# Patient Record
Sex: Female | Born: 1935 | Race: White | Hispanic: No | Smoking: Never smoker
Health system: Southern US, Community
[De-identification: ages and names within clinical notes are randomized; demographics above are authoritative.]

## PROBLEM LIST (undated history)

## (undated) DIAGNOSIS — I998 Other disorder of circulatory system: Secondary | ICD-10-CM

## (undated) DIAGNOSIS — R0600 Dyspnea, unspecified: Secondary | ICD-10-CM

## (undated) DIAGNOSIS — R609 Edema, unspecified: Secondary | ICD-10-CM

## (undated) DIAGNOSIS — E785 Hyperlipidemia, unspecified: Secondary | ICD-10-CM

## (undated) DIAGNOSIS — I2510 Atherosclerotic heart disease of native coronary artery without angina pectoris: Secondary | ICD-10-CM

## (undated) DIAGNOSIS — I10 Essential (primary) hypertension: Secondary | ICD-10-CM

## (undated) DIAGNOSIS — R0602 Shortness of breath: Secondary | ICD-10-CM

## (undated) DIAGNOSIS — M549 Dorsalgia, unspecified: Secondary | ICD-10-CM

## (undated) DIAGNOSIS — E039 Hypothyroidism, unspecified: Secondary | ICD-10-CM

## (undated) HISTORY — DX: Other disorder of circulatory system: I99.8

## (undated) HISTORY — DX: Dyspnea, unspecified: R06.00

## (undated) HISTORY — DX: Atherosclerotic heart disease of native coronary artery without angina pectoris: I25.10

## (undated) HISTORY — DX: Shortness of breath: R06.02

## (undated) HISTORY — DX: Edema, unspecified: R60.9

## (undated) HISTORY — PX: ABDOMINAL HYSTERECTOMY: SHX81

## (undated) HISTORY — DX: Essential (primary) hypertension: I10

## (undated) HISTORY — DX: Hyperlipidemia, unspecified: E78.5

## (undated) HISTORY — DX: Hypothyroidism, unspecified: E03.9

## (undated) HISTORY — DX: Dorsalgia, unspecified: M54.9

---

## 1994-01-03 HISTORY — PX: CORONARY ARTERY BYPASS GRAFT: SHX141

## 2001-04-04 HISTORY — PX: CARDIAC CATHETERIZATION: SHX172

## 2006-11-16 HISTORY — PX: CARDIAC CATHETERIZATION: SHX172

## 2009-02-08 ENCOUNTER — Inpatient Hospital Stay (HOSPITAL_COMMUNITY): Admission: EM | Admit: 2009-02-08 | Discharge: 2009-02-11 | Payer: Self-pay | Admitting: Neurosurgery

## 2009-02-19 ENCOUNTER — Encounter: Admission: RE | Admit: 2009-02-19 | Discharge: 2009-02-19 | Payer: Self-pay | Admitting: Neurosurgery

## 2009-03-05 ENCOUNTER — Encounter: Admission: RE | Admit: 2009-03-05 | Discharge: 2009-03-05 | Payer: Self-pay | Admitting: Neurosurgery

## 2010-03-25 LAB — BASIC METABOLIC PANEL
BUN: 24 mg/dL — ABNORMAL HIGH (ref 6–23)
Chloride: 104 mEq/L (ref 96–112)
Creatinine, Ser: 1.02 mg/dL (ref 0.4–1.2)
Potassium: 4.1 mEq/L (ref 3.5–5.1)

## 2010-03-25 LAB — GLUCOSE, CAPILLARY

## 2010-12-24 ENCOUNTER — Encounter (INDEPENDENT_AMBULATORY_CARE_PROVIDER_SITE_OTHER): Payer: Medicare Other | Admitting: Ophthalmology

## 2010-12-24 ENCOUNTER — Encounter (INDEPENDENT_AMBULATORY_CARE_PROVIDER_SITE_OTHER): Payer: Self-pay | Admitting: Ophthalmology

## 2010-12-24 DIAGNOSIS — H35729 Serous detachment of retinal pigment epithelium, unspecified eye: Secondary | ICD-10-CM

## 2010-12-24 DIAGNOSIS — H3530 Unspecified macular degeneration: Secondary | ICD-10-CM

## 2010-12-24 DIAGNOSIS — H43819 Vitreous degeneration, unspecified eye: Secondary | ICD-10-CM

## 2011-01-21 ENCOUNTER — Ambulatory Visit (INDEPENDENT_AMBULATORY_CARE_PROVIDER_SITE_OTHER): Payer: Medicare Other | Admitting: Ophthalmology

## 2011-02-11 ENCOUNTER — Encounter (INDEPENDENT_AMBULATORY_CARE_PROVIDER_SITE_OTHER): Payer: Medicare Other | Admitting: Ophthalmology

## 2011-02-11 DIAGNOSIS — H353290 Exudative age-related macular degeneration, unspecified eye, stage unspecified: Secondary | ICD-10-CM

## 2011-02-11 DIAGNOSIS — H43819 Vitreous degeneration, unspecified eye: Secondary | ICD-10-CM

## 2011-02-11 DIAGNOSIS — H3530 Unspecified macular degeneration: Secondary | ICD-10-CM

## 2011-02-14 ENCOUNTER — Encounter (INDEPENDENT_AMBULATORY_CARE_PROVIDER_SITE_OTHER): Payer: Medicare Other | Admitting: Ophthalmology

## 2011-02-14 DIAGNOSIS — H353290 Exudative age-related macular degeneration, unspecified eye, stage unspecified: Secondary | ICD-10-CM

## 2011-02-14 DIAGNOSIS — H3530 Unspecified macular degeneration: Secondary | ICD-10-CM

## 2011-02-14 DIAGNOSIS — H43819 Vitreous degeneration, unspecified eye: Secondary | ICD-10-CM

## 2011-03-11 ENCOUNTER — Encounter (INDEPENDENT_AMBULATORY_CARE_PROVIDER_SITE_OTHER): Payer: Medicare Other | Admitting: Ophthalmology

## 2011-03-11 DIAGNOSIS — H353290 Exudative age-related macular degeneration, unspecified eye, stage unspecified: Secondary | ICD-10-CM

## 2011-03-11 DIAGNOSIS — H3530 Unspecified macular degeneration: Secondary | ICD-10-CM

## 2011-03-11 DIAGNOSIS — H43819 Vitreous degeneration, unspecified eye: Secondary | ICD-10-CM

## 2011-04-01 ENCOUNTER — Encounter (INDEPENDENT_AMBULATORY_CARE_PROVIDER_SITE_OTHER): Payer: Medicare Other | Admitting: Ophthalmology

## 2011-04-01 DIAGNOSIS — H3530 Unspecified macular degeneration: Secondary | ICD-10-CM

## 2011-04-01 DIAGNOSIS — H43819 Vitreous degeneration, unspecified eye: Secondary | ICD-10-CM

## 2011-04-01 DIAGNOSIS — H353290 Exudative age-related macular degeneration, unspecified eye, stage unspecified: Secondary | ICD-10-CM

## 2011-04-29 ENCOUNTER — Encounter (INDEPENDENT_AMBULATORY_CARE_PROVIDER_SITE_OTHER): Payer: Medicare Other | Admitting: Ophthalmology

## 2011-04-29 DIAGNOSIS — H3530 Unspecified macular degeneration: Secondary | ICD-10-CM

## 2011-04-29 DIAGNOSIS — H353290 Exudative age-related macular degeneration, unspecified eye, stage unspecified: Secondary | ICD-10-CM

## 2011-04-29 DIAGNOSIS — H43819 Vitreous degeneration, unspecified eye: Secondary | ICD-10-CM

## 2011-05-26 ENCOUNTER — Encounter (INDEPENDENT_AMBULATORY_CARE_PROVIDER_SITE_OTHER): Payer: Medicare Other | Admitting: Ophthalmology

## 2011-05-26 DIAGNOSIS — H3530 Unspecified macular degeneration: Secondary | ICD-10-CM

## 2011-05-26 DIAGNOSIS — H43819 Vitreous degeneration, unspecified eye: Secondary | ICD-10-CM

## 2011-05-26 DIAGNOSIS — H353290 Exudative age-related macular degeneration, unspecified eye, stage unspecified: Secondary | ICD-10-CM

## 2011-06-04 DIAGNOSIS — I10 Essential (primary) hypertension: Secondary | ICD-10-CM

## 2011-06-04 HISTORY — DX: Essential (primary) hypertension: I10

## 2011-06-22 DIAGNOSIS — R609 Edema, unspecified: Secondary | ICD-10-CM

## 2011-06-22 HISTORY — DX: Edema, unspecified: R60.9

## 2011-06-23 ENCOUNTER — Encounter (INDEPENDENT_AMBULATORY_CARE_PROVIDER_SITE_OTHER): Payer: Medicare Other | Admitting: Ophthalmology

## 2011-06-23 DIAGNOSIS — H3530 Unspecified macular degeneration: Secondary | ICD-10-CM

## 2011-06-23 DIAGNOSIS — H43819 Vitreous degeneration, unspecified eye: Secondary | ICD-10-CM

## 2011-06-23 DIAGNOSIS — H353290 Exudative age-related macular degeneration, unspecified eye, stage unspecified: Secondary | ICD-10-CM

## 2011-07-21 ENCOUNTER — Encounter (INDEPENDENT_AMBULATORY_CARE_PROVIDER_SITE_OTHER): Payer: Medicare Other | Admitting: Ophthalmology

## 2011-07-21 DIAGNOSIS — H35039 Hypertensive retinopathy, unspecified eye: Secondary | ICD-10-CM

## 2011-07-21 DIAGNOSIS — I10 Essential (primary) hypertension: Secondary | ICD-10-CM

## 2011-07-21 DIAGNOSIS — H43819 Vitreous degeneration, unspecified eye: Secondary | ICD-10-CM

## 2011-07-21 DIAGNOSIS — H353290 Exudative age-related macular degeneration, unspecified eye, stage unspecified: Secondary | ICD-10-CM

## 2011-07-21 DIAGNOSIS — H3530 Unspecified macular degeneration: Secondary | ICD-10-CM

## 2011-08-25 ENCOUNTER — Encounter (INDEPENDENT_AMBULATORY_CARE_PROVIDER_SITE_OTHER): Payer: Medicare Other | Admitting: Ophthalmology

## 2011-08-25 DIAGNOSIS — I10 Essential (primary) hypertension: Secondary | ICD-10-CM

## 2011-08-25 DIAGNOSIS — H353290 Exudative age-related macular degeneration, unspecified eye, stage unspecified: Secondary | ICD-10-CM

## 2011-08-25 DIAGNOSIS — H43819 Vitreous degeneration, unspecified eye: Secondary | ICD-10-CM

## 2011-08-25 DIAGNOSIS — H3530 Unspecified macular degeneration: Secondary | ICD-10-CM

## 2011-09-28 ENCOUNTER — Encounter (INDEPENDENT_AMBULATORY_CARE_PROVIDER_SITE_OTHER): Payer: Medicare Other | Admitting: Ophthalmology

## 2011-09-28 DIAGNOSIS — H43819 Vitreous degeneration, unspecified eye: Secondary | ICD-10-CM

## 2011-09-28 DIAGNOSIS — H3530 Unspecified macular degeneration: Secondary | ICD-10-CM

## 2011-09-28 DIAGNOSIS — I10 Essential (primary) hypertension: Secondary | ICD-10-CM

## 2011-09-28 DIAGNOSIS — H353290 Exudative age-related macular degeneration, unspecified eye, stage unspecified: Secondary | ICD-10-CM

## 2011-09-28 DIAGNOSIS — H35039 Hypertensive retinopathy, unspecified eye: Secondary | ICD-10-CM

## 2011-10-04 DIAGNOSIS — I998 Other disorder of circulatory system: Secondary | ICD-10-CM

## 2011-10-04 HISTORY — DX: Other disorder of circulatory system: I99.8

## 2011-10-11 DIAGNOSIS — R0600 Dyspnea, unspecified: Secondary | ICD-10-CM

## 2011-10-11 HISTORY — DX: Dyspnea, unspecified: R06.00

## 2011-11-02 ENCOUNTER — Encounter (INDEPENDENT_AMBULATORY_CARE_PROVIDER_SITE_OTHER): Payer: Medicare Other | Admitting: Ophthalmology

## 2011-11-02 DIAGNOSIS — H353290 Exudative age-related macular degeneration, unspecified eye, stage unspecified: Secondary | ICD-10-CM

## 2011-11-02 DIAGNOSIS — H43819 Vitreous degeneration, unspecified eye: Secondary | ICD-10-CM

## 2011-11-02 DIAGNOSIS — I10 Essential (primary) hypertension: Secondary | ICD-10-CM

## 2011-11-02 DIAGNOSIS — H35039 Hypertensive retinopathy, unspecified eye: Secondary | ICD-10-CM

## 2011-11-02 DIAGNOSIS — H3530 Unspecified macular degeneration: Secondary | ICD-10-CM

## 2011-12-07 ENCOUNTER — Encounter (INDEPENDENT_AMBULATORY_CARE_PROVIDER_SITE_OTHER): Payer: Medicare Other | Admitting: Ophthalmology

## 2011-12-07 DIAGNOSIS — H43819 Vitreous degeneration, unspecified eye: Secondary | ICD-10-CM

## 2011-12-07 DIAGNOSIS — H353290 Exudative age-related macular degeneration, unspecified eye, stage unspecified: Secondary | ICD-10-CM

## 2011-12-07 DIAGNOSIS — H35039 Hypertensive retinopathy, unspecified eye: Secondary | ICD-10-CM

## 2011-12-07 DIAGNOSIS — H3530 Unspecified macular degeneration: Secondary | ICD-10-CM

## 2011-12-07 DIAGNOSIS — I10 Essential (primary) hypertension: Secondary | ICD-10-CM

## 2012-01-18 ENCOUNTER — Encounter (INDEPENDENT_AMBULATORY_CARE_PROVIDER_SITE_OTHER): Payer: Medicare Other | Admitting: Ophthalmology

## 2012-01-25 ENCOUNTER — Encounter (INDEPENDENT_AMBULATORY_CARE_PROVIDER_SITE_OTHER): Payer: Medicare Other | Admitting: Ophthalmology

## 2012-02-06 ENCOUNTER — Encounter (INDEPENDENT_AMBULATORY_CARE_PROVIDER_SITE_OTHER): Payer: Medicare Other | Admitting: Ophthalmology

## 2012-02-06 DIAGNOSIS — I10 Essential (primary) hypertension: Secondary | ICD-10-CM

## 2012-02-06 DIAGNOSIS — H3530 Unspecified macular degeneration: Secondary | ICD-10-CM

## 2012-02-06 DIAGNOSIS — H35039 Hypertensive retinopathy, unspecified eye: Secondary | ICD-10-CM

## 2012-02-06 DIAGNOSIS — H353290 Exudative age-related macular degeneration, unspecified eye, stage unspecified: Secondary | ICD-10-CM

## 2012-02-06 DIAGNOSIS — H43819 Vitreous degeneration, unspecified eye: Secondary | ICD-10-CM

## 2012-04-02 ENCOUNTER — Encounter (INDEPENDENT_AMBULATORY_CARE_PROVIDER_SITE_OTHER): Payer: PRIVATE HEALTH INSURANCE | Admitting: Ophthalmology

## 2012-04-02 DIAGNOSIS — H3530 Unspecified macular degeneration: Secondary | ICD-10-CM

## 2012-04-02 DIAGNOSIS — I10 Essential (primary) hypertension: Secondary | ICD-10-CM

## 2012-04-02 DIAGNOSIS — H43819 Vitreous degeneration, unspecified eye: Secondary | ICD-10-CM

## 2012-04-02 DIAGNOSIS — H353290 Exudative age-related macular degeneration, unspecified eye, stage unspecified: Secondary | ICD-10-CM

## 2012-04-02 DIAGNOSIS — H35039 Hypertensive retinopathy, unspecified eye: Secondary | ICD-10-CM

## 2012-05-23 ENCOUNTER — Other Ambulatory Visit: Payer: Self-pay | Admitting: *Deleted

## 2012-05-23 MED ORDER — METOPROLOL SUCCINATE ER 100 MG PO TB24: ORAL_TABLET | ORAL | Status: DC

## 2012-05-24 ENCOUNTER — Other Ambulatory Visit: Payer: Self-pay | Admitting: *Deleted

## 2012-05-28 ENCOUNTER — Encounter: Payer: Self-pay | Admitting: *Deleted

## 2012-05-29 ENCOUNTER — Encounter (INDEPENDENT_AMBULATORY_CARE_PROVIDER_SITE_OTHER): Payer: PRIVATE HEALTH INSURANCE | Admitting: Ophthalmology

## 2012-05-29 DIAGNOSIS — H3530 Unspecified macular degeneration: Secondary | ICD-10-CM

## 2012-05-29 DIAGNOSIS — H43819 Vitreous degeneration, unspecified eye: Secondary | ICD-10-CM

## 2012-05-29 DIAGNOSIS — H353290 Exudative age-related macular degeneration, unspecified eye, stage unspecified: Secondary | ICD-10-CM

## 2012-05-29 DIAGNOSIS — I10 Essential (primary) hypertension: Secondary | ICD-10-CM

## 2012-05-29 DIAGNOSIS — H35039 Hypertensive retinopathy, unspecified eye: Secondary | ICD-10-CM

## 2012-06-26 ENCOUNTER — Encounter: Payer: Self-pay | Admitting: Internal Medicine

## 2012-07-23 ENCOUNTER — Encounter (INDEPENDENT_AMBULATORY_CARE_PROVIDER_SITE_OTHER): Payer: PRIVATE HEALTH INSURANCE | Admitting: Ophthalmology

## 2012-07-23 DIAGNOSIS — H43819 Vitreous degeneration, unspecified eye: Secondary | ICD-10-CM

## 2012-07-23 DIAGNOSIS — I10 Essential (primary) hypertension: Secondary | ICD-10-CM

## 2012-07-23 DIAGNOSIS — H353290 Exudative age-related macular degeneration, unspecified eye, stage unspecified: Secondary | ICD-10-CM

## 2012-07-23 DIAGNOSIS — H3530 Unspecified macular degeneration: Secondary | ICD-10-CM

## 2012-07-23 DIAGNOSIS — H35039 Hypertensive retinopathy, unspecified eye: Secondary | ICD-10-CM

## 2012-08-01 ENCOUNTER — Other Ambulatory Visit: Payer: Self-pay | Admitting: *Deleted

## 2012-08-01 MED ORDER — VALSARTAN-HYDROCHLOROTHIAZIDE 320-25 MG PO TABS: 1.0000 | ORAL_TABLET | Freq: Every day | ORAL | Status: DC

## 2012-08-01 NOTE — Telephone Encounter (Signed)
Rx was sent to pharmacy electronically. 

## 2012-09-10 ENCOUNTER — Other Ambulatory Visit: Payer: Self-pay | Admitting: *Deleted

## 2012-09-10 MED ORDER — NITROGLYCERIN 0.4 MG SL SUBL: SUBLINGUAL_TABLET | SUBLINGUAL | Status: AC

## 2012-09-10 MED ORDER — FUROSEMIDE 20 MG PO TABS: 20.0000 mg | ORAL_TABLET | Freq: Every day | ORAL | Status: DC

## 2012-09-10 NOTE — Telephone Encounter (Signed)
Rx was sent to pharmacy electronically. 

## 2012-09-17 ENCOUNTER — Encounter (INDEPENDENT_AMBULATORY_CARE_PROVIDER_SITE_OTHER): Payer: PRIVATE HEALTH INSURANCE | Admitting: Ophthalmology

## 2012-09-17 DIAGNOSIS — I10 Essential (primary) hypertension: Secondary | ICD-10-CM

## 2012-09-17 DIAGNOSIS — H3530 Unspecified macular degeneration: Secondary | ICD-10-CM

## 2012-09-17 DIAGNOSIS — H353290 Exudative age-related macular degeneration, unspecified eye, stage unspecified: Secondary | ICD-10-CM

## 2012-09-17 DIAGNOSIS — H43819 Vitreous degeneration, unspecified eye: Secondary | ICD-10-CM

## 2012-09-17 DIAGNOSIS — H35039 Hypertensive retinopathy, unspecified eye: Secondary | ICD-10-CM

## 2012-10-03 ENCOUNTER — Ambulatory Visit: Payer: Medicare Other | Admitting: Cardiovascular Disease

## 2012-10-05 ENCOUNTER — Encounter: Payer: Self-pay | Admitting: Cardiovascular Disease

## 2012-10-05 ENCOUNTER — Ambulatory Visit (INDEPENDENT_AMBULATORY_CARE_PROVIDER_SITE_OTHER): Payer: Medicare Other | Admitting: Cardiovascular Disease

## 2012-10-05 VITALS — BP 154/71 | HR 57 | Ht 65.0 in | Wt 174.0 lb

## 2012-10-05 DIAGNOSIS — R0609 Other forms of dyspnea: Secondary | ICD-10-CM

## 2012-10-05 DIAGNOSIS — I10 Essential (primary) hypertension: Secondary | ICD-10-CM

## 2012-10-05 DIAGNOSIS — I25810 Atherosclerosis of coronary artery bypass graft(s) without angina pectoris: Secondary | ICD-10-CM

## 2012-10-05 DIAGNOSIS — I4949 Other premature depolarization: Secondary | ICD-10-CM

## 2012-10-05 DIAGNOSIS — E785 Hyperlipidemia, unspecified: Secondary | ICD-10-CM

## 2012-10-05 DIAGNOSIS — I493 Ventricular premature depolarization: Secondary | ICD-10-CM | POA: Insufficient documentation

## 2012-10-05 MED ORDER — RANOLAZINE ER 500 MG PO TB12: 500.0000 mg | ORAL_TABLET | Freq: Two times a day (BID) | ORAL | Status: DC

## 2012-10-05 MED ORDER — AMLODIPINE BESYLATE 5 MG PO TABS: 5.0000 mg | ORAL_TABLET | Freq: Every day | ORAL | Status: DC

## 2012-10-05 NOTE — Progress Notes (Signed)
Patient ID: Alexis Dyer, female   DOB: Sep 06, 1923, 79 y.o.   MRN: [id number]       CARDIOLOGY CONSULT NOTE  Patient ID: Alexis Dyer MRN: [id number] DOB/AGE: 1923/10/13 79 y.o.  Admit date: (Not on file) Primary Physician No primary provider on file.  Reason for Consultation: cabg, htn, hyperlipidemia, PVC's  HPI: Alexis Dyer is an 79 year old woman who was most recently seen in 04/2012 by Dr. Rennis Golden of [facility]. She has a h/o CABG in 1996, HTN, hyperlipidemia, and bigeminal and trigeminal PVC's. She reportedly had a stress test in 10/2011 which revealed normal LV perfusion with an LVEF of 67%, as per previous office notes and not the official report. She also reportedly had an echocardiogram in 06/2011 which revealed an EF greater than 55%, and mild to moderate tricuspid regurgitation and mild to moderate pulmonary HTN.  She says she struggles with severe GERD and sometimes has to sit up in bed to alleviate the burning. Over the summer, she had 2 or 3 episodes of left arm pain and left-sided shoulder blade pain which was alleviated with one SL nitro. She denies palpitations. She wears trouser stockings (not actual support hose) which alleviate leg cramps.  She has occasional lightheadedness but denies syncope. She gets dyspnea with exertion, from walking as far as from the office to the parking lot. Her dyspnea has not progressed in intensity.  She checks her BP at home and the SBP runs from 147-160 mmHg range.  SocHx: her sister is Alexis Dyer. Nonsmoker.    No Known Allergies  Current Outpatient Prescriptions  Medication Sig Dispense Refill  . aspirin 81 MG tablet Take 81 mg by mouth daily.      . CRESTOR 10 MG tablet Take 1 tablet by mouth daily.      . fenofibrate (TRICOR) 145 MG tablet Take 145 mg by mouth daily.      . fish oil-omega-3 fatty acids 1000 MG capsule Take 1 g by mouth daily. Includes magnesium and zinc and calcium      . furosemide (LASIX) 20 MG tablet  Take 1 tablet (20 mg total) by mouth daily.  30 tablet  8  . isosorbide mononitrate (IMDUR) 60 MG 24 hr tablet Take 60 mg by mouth every morning. & 30 mg by mouth in the evening      . levothyroxine (SYNTHROID, LEVOTHROID) 100 MCG tablet Take 100 mcg by mouth daily before breakfast.      . metoprolol succinate (TOPROL-XL) 100 MG 24 hr tablet Take 1 and 1/2 tablets by mouth daily Take with or immediately following a meal.  140 tablet  3  . nitroGLYCERIN (NITROSTAT) 0.4 MG SL tablet Please 1 tablet under the tongue every 5 minutes for up to 3 doses as needed for chest pain.  25 tablet  4  . ranolazine (RANEXA) 500 MG 12 hr tablet Take 500 mg by mouth daily.      . valsartan-hydrochlorothiazide (DIOVAN-HCT) 320-25 MG per tablet Take 1 tablet by mouth daily.  30 tablet  9   No current facility-administered medications for this visit.    Past Medical History  Diagnosis Date  . SOB (shortness of breath)   . Hypertension 06/2011    2D Echo EF>55% mild to moderate tricuspid regurgitation and mild to moderate pulmonary hypertension.  . Ischemia 10/2011    stress test EF 67% with no ischemia  . Dyslipidemia   . Hypothyroidism   . CAD (coronary artery disease)  bypass in 1996 2D Echo preformed EF >55%  . Edema 06/22/2011    lower arterial for claudication  . Dyspnea 10/11/2011    stress test EF 67% normal stress test.  . Back pain     Past Surgical History  Procedure Laterality Date  . Coronary artery bypass graft  1996    LIMA to the obtuse marginal, saphenous vein graft to the LAD, saphenous vein graft to the obtuse marginal-1, saphenous vien graft to the posterior diagonal artery performed by Dr Alean Rinne  . Abdominal hysterectomy    . Cardiac catheterization  11/16/2006  . Cardiac catheterization  04/04/2001    History   Social History  . Marital Status: Widowed    Spouse Name: N/A    Number of Children: N/A  . Years of Education: N/A   Occupational History  . Not on  file.   Social History Main Topics  . Smoking status: Never Smoker   . Smokeless tobacco: Never Used  . Alcohol Use: Not on file  . Drug Use: Not on file  . Sexual Activity: Not on file   Other Topics Concern  . Not on file   Social History Narrative  . No narrative on file     Family History  Problem Relation Age of Onset  . Heart attack Father   . Cancer Sister   . Heart disease Sister   . Diabetes    . Hypertension       Prior to Admission medications   Medication Sig Start Date End Date Taking? Authorizing Provider  aspirin 81 MG tablet Take 81 mg by mouth daily.   Yes Historical Provider, MD  CRESTOR 10 MG tablet Take 1 tablet by mouth daily. 08/23/12  Yes Historical Provider, MD  fenofibrate (TRICOR) 145 MG tablet Take 145 mg by mouth daily.   Yes Historical Provider, MD  fish oil-omega-3 fatty acids 1000 MG capsule Take 1 g by mouth daily. Includes magnesium and zinc and calcium   Yes Historical Provider, MD  furosemide (LASIX) 20 MG tablet Take 1 tablet (20 mg total) by mouth daily. 09/10/12  Yes Chrystie Nose, MD  isosorbide mononitrate (IMDUR) 60 MG 24 hr tablet Take 60 mg by mouth every morning. & 30 mg by mouth in the evening   Yes Historical Provider, MD  levothyroxine (SYNTHROID, LEVOTHROID) 100 MCG tablet Take 100 mcg by mouth daily before breakfast.   Yes Historical Provider, MD  metoprolol succinate (TOPROL-XL) 100 MG 24 hr tablet Take 1 and 1/2 tablets by mouth daily Take with or immediately following a meal. 05/23/12  Yes Chrystie Nose, MD  nitroGLYCERIN (NITROSTAT) 0.4 MG SL tablet Please 1 tablet under the tongue every 5 minutes for up to 3 doses as needed for chest pain. 09/10/12  Yes Chrystie Nose, MD  ranolazine (RANEXA) 500 MG 12 hr tablet Take 500 mg by mouth daily.   Yes Historical Provider, MD  valsartan-hydrochlorothiazide (DIOVAN-HCT) 320-25 MG per tablet Take 1 tablet by mouth daily. 08/01/12  Yes Chrystie Nose, MD     Review of systems  complete and found to be negative unless listed above in HPI     Physical exam Blood pressure 154/71, pulse 57, height 5\' 5"  (1.651 m), weight 174 lb (78.926 kg). General: NAD Neck: No JVD, no thyromegaly or thyroid nodule.  Lungs: Clear to auscultation bilaterally with normal respiratory effort. CV: Nondisplaced PMI.  Heart regular S1/S2, no S3/S4, no murmur.  No peripheral edema.  No carotid bruit.  Normal pedal pulses.  Abdomen: Soft, nontender, no hepatosplenomegaly, no distention.  Skin: Intact without lesions or rashes.  Neurologic: Alert and oriented x 3.  Psych: Normal affect. Extremities: No clubbing or cyanosis.  HEENT: Normal.   Labs:   No results found for this basename: WBC, HGB, HCT, MCV, PLT   No results found for this basename: NA, K, CL, CO2, BUN, CREATININE, CALCIUM, LABALBU, PROT, BILITOT, ALKPHOS, ALT, AST, GLUCOSE,  in the last 168 hours No results found for this basename: CKTOTAL, CKMB, CKMBINDEX, TROPONINI    No results found for this basename: CHOL   No results found for this basename: HDL   No results found for this basename: LDLCALC   No results found for this basename: TRIG   No results found for this basename: CHOLHDL   No results found for this basename: LDLDIRECT         Studies: See HPI  ASSESSMENT AND PLAN: 1. CAD s/p CABG: given her dyspnea and occasional left arm discomfort, I will increase Ranexa to 500 mg bid. No indication for further non-invasive testing at this time. Of note, she had normal LV perfusion in 10/2011. Continue ASA, Crestor, Toprol-XL, and Imdur. 2. HTN: presently uncontrolled. Will start amlodipine 5 mg daily. Continue Diovan-HCT. 3. Dyspnea on exertion: this may be due to a combination of CAD and uncontrolled HTN, with medication adjustments as noted above.   Signed: Prentice Docker, M.D., F.A.C.C.  10/05/2012, 11:36 AM

## 2012-10-05 NOTE — Patient Instructions (Signed)
   Begin Norvasc 5mg  daily  Increase Ranexa to 500mg  twice a day  Continue all other medications.   Your physician wants you to follow up in: 6 months.  You will receive a reminder letter in the mail one-two months in advance.  If you don't receive a letter, please call our office to schedule the follow up appointment

## 2012-10-17 ENCOUNTER — Telehealth: Payer: Self-pay | Admitting: *Deleted

## 2012-10-17 MED ORDER — HYDRALAZINE HCL 25 MG PO TABS: 25.0000 mg | ORAL_TABLET | Freq: Two times a day (BID) | ORAL | Status: DC

## 2012-10-17 NOTE — Telephone Encounter (Signed)
Patient informed and verbalized understanding of plan. 

## 2012-10-17 NOTE — Telephone Encounter (Signed)
Patient's had both stress testing and echocardiography in 2013, echo in 06/2011. No indication to repeat testing as her "heart's pumping function is normal". She had lipids done in April 2014 and these will be repeated in April 2015. Please reassure patient. She can d/c amlodipine due to nausea, and start hydralazine 25 mg BID for BP control.

## 2012-10-17 NOTE — Telephone Encounter (Signed)
Patient concerned that she hasn't had any lab work or echo done recently. Patient thinks its time to have this done. Patient also informed nurse that she started having symptoms of weakness, and nausea after starting the norvasc. Patient said she stopped the norvasc and her symptoms improved. Patient said she re-challenged the norvasc and her symptoms came back. Nurse advised patient that MD would be notified of there request for lab work, and echo as well as the problem she had with her amlodipine. Nurse advised patient to continue holding the norvasc until further notice from our office.

## 2012-10-30 ENCOUNTER — Other Ambulatory Visit: Payer: Self-pay | Admitting: Cardiology

## 2012-10-30 MED ORDER — METOPROLOL SUCCINATE ER 100 MG PO TB24: ORAL_TABLET | ORAL | Status: DC

## 2012-11-13 ENCOUNTER — Encounter: Payer: Self-pay | Admitting: Cardiology

## 2012-11-26 ENCOUNTER — Encounter (INDEPENDENT_AMBULATORY_CARE_PROVIDER_SITE_OTHER): Payer: PRIVATE HEALTH INSURANCE | Admitting: Ophthalmology

## 2012-11-26 DIAGNOSIS — H353290 Exudative age-related macular degeneration, unspecified eye, stage unspecified: Secondary | ICD-10-CM

## 2012-11-26 DIAGNOSIS — H35039 Hypertensive retinopathy, unspecified eye: Secondary | ICD-10-CM

## 2012-11-26 DIAGNOSIS — H43819 Vitreous degeneration, unspecified eye: Secondary | ICD-10-CM

## 2012-11-26 DIAGNOSIS — I10 Essential (primary) hypertension: Secondary | ICD-10-CM

## 2012-11-26 DIAGNOSIS — H3530 Unspecified macular degeneration: Secondary | ICD-10-CM

## 2013-02-04 ENCOUNTER — Encounter (INDEPENDENT_AMBULATORY_CARE_PROVIDER_SITE_OTHER): Payer: PRIVATE HEALTH INSURANCE | Admitting: Ophthalmology

## 2013-02-04 DIAGNOSIS — H43819 Vitreous degeneration, unspecified eye: Secondary | ICD-10-CM

## 2013-02-04 DIAGNOSIS — I10 Essential (primary) hypertension: Secondary | ICD-10-CM

## 2013-02-04 DIAGNOSIS — H35039 Hypertensive retinopathy, unspecified eye: Secondary | ICD-10-CM

## 2013-02-04 DIAGNOSIS — H3530 Unspecified macular degeneration: Secondary | ICD-10-CM

## 2013-02-04 DIAGNOSIS — H353290 Exudative age-related macular degeneration, unspecified eye, stage unspecified: Secondary | ICD-10-CM

## 2013-04-29 ENCOUNTER — Encounter (INDEPENDENT_AMBULATORY_CARE_PROVIDER_SITE_OTHER): Payer: PRIVATE HEALTH INSURANCE | Admitting: Ophthalmology

## 2013-04-29 DIAGNOSIS — H35039 Hypertensive retinopathy, unspecified eye: Secondary | ICD-10-CM

## 2013-04-29 DIAGNOSIS — H3530 Unspecified macular degeneration: Secondary | ICD-10-CM

## 2013-04-29 DIAGNOSIS — I10 Essential (primary) hypertension: Secondary | ICD-10-CM

## 2013-04-29 DIAGNOSIS — H43819 Vitreous degeneration, unspecified eye: Secondary | ICD-10-CM

## 2013-04-29 DIAGNOSIS — H353290 Exudative age-related macular degeneration, unspecified eye, stage unspecified: Secondary | ICD-10-CM

## 2013-05-13 ENCOUNTER — Ambulatory Visit (INDEPENDENT_AMBULATORY_CARE_PROVIDER_SITE_OTHER): Payer: Medicare Other | Admitting: Cardiovascular Disease

## 2013-05-13 ENCOUNTER — Encounter: Payer: Self-pay | Admitting: *Deleted

## 2013-05-13 ENCOUNTER — Encounter: Payer: Self-pay | Admitting: Cardiovascular Disease

## 2013-05-13 VITALS — BP 123/61 | HR 59 | Ht 64.5 in | Wt 177.8 lb

## 2013-05-13 DIAGNOSIS — R5381 Other malaise: Secondary | ICD-10-CM

## 2013-05-13 DIAGNOSIS — I10 Essential (primary) hypertension: Secondary | ICD-10-CM

## 2013-05-13 DIAGNOSIS — Z79899 Other long term (current) drug therapy: Secondary | ICD-10-CM

## 2013-05-13 DIAGNOSIS — I493 Ventricular premature depolarization: Secondary | ICD-10-CM

## 2013-05-13 DIAGNOSIS — R0609 Other forms of dyspnea: Secondary | ICD-10-CM

## 2013-05-13 DIAGNOSIS — R0989 Other specified symptoms and signs involving the circulatory and respiratory systems: Secondary | ICD-10-CM

## 2013-05-13 DIAGNOSIS — E785 Hyperlipidemia, unspecified: Secondary | ICD-10-CM

## 2013-05-13 DIAGNOSIS — I4949 Other premature depolarization: Secondary | ICD-10-CM

## 2013-05-13 DIAGNOSIS — I25810 Atherosclerosis of coronary artery bypass graft(s) without angina pectoris: Secondary | ICD-10-CM

## 2013-05-13 DIAGNOSIS — R5383 Other fatigue: Secondary | ICD-10-CM

## 2013-05-13 MED ORDER — METOPROLOL SUCCINATE ER 50 MG PO TB24: ORAL_TABLET | ORAL | Status: DC

## 2013-05-13 NOTE — Patient Instructions (Signed)
   Decrease Metoprolol to 50mg  every morning & 25mg  every evening - new sent to pharm Continue all other medications.   Your physician has requested that you have a lexiscan myoview. For further information please visit [email or web address]. Please follow instruction sheet, as given. Office will contact with results via phone or letter.   Follow up in  2 months

## 2013-05-13 NOTE — Progress Notes (Signed)
Patient ID: Alexis Dyer, female   DOB: 12-30-1923, 80 y.o.   MRN: [id number]      SUBJECTIVE: Mrs. Vivia EwingWheatley is an 80 year old woman who has a h/o CABG in 1996, HTN, hyperlipidemia, and bigeminal and trigeminal PVC's. She reportedly had a stress test in 10/2011 which revealed normal LV perfusion with an LVEF of 67%, as per previous office notes and not the official report. She also reportedly had an echocardiogram in 06/2011 which revealed an EF greater than 55%, and mild to moderate tricuspid regurgitation and mild to moderate pulmonary HTN.  She stopped taking Crestor due to leg cramps, which have since subsided.  ECG today shows normal sinus rhythm with a right bundle branch block and nonspecific ST segment abnormality. First degree AV block is also noted with a PR interval of 226 ms.  She continues to experience dyspnea with exertion. This has been going on for several months. Prior to CABG, her symptoms were both chest pain and shortness of breath. She denies chest pain. She said her systolic blood pressures are sometimes as high as 150-160 in the evening but not all the time. Her legs have felt weak for several months as well. She wakes up at 8:00 in the morning but does not feel like she gets any energy until about 1 or 2 in the afternoon. She currently takes 150 mg of metoprolol succinate daily. She's had a few falls as well but denies syncope.   SocHx: her sister is Camera operatorMary Pulliam. Nonsmoker.     Allergies  Allergen Reactions  . Crestor [Rosuvastatin] Other (See Comments)    Leg Cramps    Current Outpatient Prescriptions  Medication Sig Dispense Refill  . aspirin 81 MG tablet Take 81 mg by mouth daily.      . fish oil-omega-3 fatty acids 1000 MG capsule Take 1 g by mouth daily. Includes magnesium and zinc and calcium      . furosemide (LASIX) 20 MG tablet Take 1 tablet (20 mg total) by mouth daily.  30 tablet  8  . hydrALAZINE (APRESOLINE) 25 MG tablet Take 1 tablet (25 mg  total) by mouth 2 (two) times daily.  180 tablet  3  . isosorbide mononitrate (IMDUR) 60 MG 24 hr tablet Take 60 mg by mouth every morning. & 30 mg by mouth in the evening      . levothyroxine (SYNTHROID, LEVOTHROID) 100 MCG tablet Take 100 mcg by mouth daily before breakfast.      . metoprolol succinate (TOPROL-XL) 100 MG 24 hr tablet Take 1 and 1/2 tablets by mouth daily Take with or immediately following a meal.  60 tablet  6  . nitroGLYCERIN (NITROSTAT) 0.4 MG SL tablet Please 1 tablet under the tongue every 5 minutes for up to 3 doses as needed for chest pain.  25 tablet  4  . ranolazine (RANEXA) 500 MG 12 hr tablet Take 1 tablet (500 mg total) by mouth 2 (two) times daily.  60 tablet  6  . valsartan-hydrochlorothiazide (DIOVAN-HCT) 320-25 MG per tablet Take 1 tablet by mouth daily.  30 tablet  9  . CRESTOR 10 MG tablet Take 1 tablet by mouth daily.       No current facility-administered medications for this visit.    Past Medical History  Diagnosis Date  . SOB (shortness of breath)   . Hypertension 06/2011    2D Echo EF>55% mild to moderate tricuspid regurgitation and mild to moderate pulmonary hypertension.  . Ischemia 10/2011  stress test EF 67% with no ischemia  . Dyslipidemia   . Hypothyroidism   . CAD (coronary artery disease)     bypass in 1996 2D Echo preformed EF >55%  . Edema 06/22/2011    lower arterial for claudication  . Dyspnea 10/11/2011    stress test EF 67% normal stress test.  . Back pain     Past Surgical History  Procedure Laterality Date  . Coronary artery bypass graft  1996    LIMA to the obtuse marginal, saphenous vein graft to the LAD, saphenous vein graft to the obtuse marginal-1, saphenous vien graft to the posterior diagonal artery performed by Dr Alean RinneMichael High  . Abdominal hysterectomy    . Cardiac catheterization  11/16/2006  . Cardiac catheterization  04/04/2001    History   Social History  . Marital Status: Widowed    Spouse Name: N/A      Number of Children: N/A  . Years of Education: N/A   Occupational History  . Not on file.   Social History Main Topics  . Smoking status: Never Smoker   . Smokeless tobacco: Never Used  . Alcohol Use: Not on file  . Drug Use: Not on file  . Sexual Activity: Not on file   Other Topics Concern  . Not on file   Social History Narrative  . No narrative on file     Filed Vitals:   05/13/13 1002  BP: 123/61  Pulse: 59  Height: 5' 4.5" (1.638 m)  Weight: 177 lb 12.8 oz (80.65 kg)    PHYSICAL EXAM General: NAD Neck: No JVD, no thyromegaly. Lungs: Clear to auscultation bilaterally with normal respiratory effort. CV: Nondisplaced PMI.  Regular rate and rhythm, normal S1/S2, no S3/S4, no murmur. No pretibial or periankle edema.  No carotid bruit.  Normal pedal pulses.  Abdomen: Soft, nontender, no hepatosplenomegaly, no distention.  Neurologic: Alert and oriented x 3.  Psych: Normal affect. Extremities: No clubbing or cyanosis.   ECG: reviewed and available in electronic records.      ASSESSMENT AND PLAN: 1. Dyspnea with exertion, in the setting of CAD and CABG: Given her continued complaints of dyspnea with exertion (had similar symptoms prior to CABG), I will obtain a Lexiscan Cardiolite. Of note, she had normal LV perfusion in 10/2011. Continue ASA, Ranexa, and Imdur. I feel that some of her symptoms related to fatigue and weakness may also be related to high doses of metoprolol succinate. Her resting heart is 59 beats per minute. I will reduce Toprol-XL to 50 mg every morning and 25 mg every evening, effectively cutting her dose in half. 2. HTN: Presently controlled. Continue Diovan-HCT and hydralazine. She did not tolerate amlodipine. 3. Hyperlipidemia: Will check lipids. Based on these results, I may then start her on pravastatin.  Dispo: f/u 2 months.  Prentice DockerSuresh Koneswaran, M.D., F.A.C.C.

## 2013-05-20 ENCOUNTER — Encounter (HOSPITAL_COMMUNITY)
Admission: RE | Admit: 2013-05-20 | Discharge: 2013-05-20 | Disposition: A | Discharge status: Home / Self Care | Payer: Medicare Other | Source: Ambulatory Visit | Attending: Cardiovascular Disease | Admitting: Cardiovascular Disease

## 2013-05-20 ENCOUNTER — Encounter (HOSPITAL_COMMUNITY)
Admission: RE | Admit: 2013-05-20 | Discharge: 2013-05-20 | Disposition: A | Discharge status: Home / Self Care | Payer: Medicare Other | Source: Ambulatory Visit | Attending: Family Medicine | Admitting: Family Medicine

## 2013-05-20 ENCOUNTER — Encounter (HOSPITAL_COMMUNITY): Payer: Self-pay

## 2013-05-20 DIAGNOSIS — R0609 Other forms of dyspnea: Secondary | ICD-10-CM | POA: Insufficient documentation

## 2013-05-20 DIAGNOSIS — R0989 Other specified symptoms and signs involving the circulatory and respiratory systems: Principal | ICD-10-CM | POA: Insufficient documentation

## 2013-05-20 DIAGNOSIS — I2589 Other forms of chronic ischemic heart disease: Secondary | ICD-10-CM | POA: Insufficient documentation

## 2013-05-20 DIAGNOSIS — I2510 Atherosclerotic heart disease of native coronary artery without angina pectoris: Secondary | ICD-10-CM | POA: Insufficient documentation

## 2013-05-20 DIAGNOSIS — Z951 Presence of aortocoronary bypass graft: Secondary | ICD-10-CM | POA: Insufficient documentation

## 2013-05-20 DIAGNOSIS — I25810 Atherosclerosis of coronary artery bypass graft(s) without angina pectoris: Secondary | ICD-10-CM

## 2013-05-20 DIAGNOSIS — R9439 Abnormal result of other cardiovascular function study: Secondary | ICD-10-CM | POA: Insufficient documentation

## 2013-05-20 DIAGNOSIS — R079 Chest pain, unspecified: Secondary | ICD-10-CM | POA: Insufficient documentation

## 2013-05-20 MED ORDER — TECHNETIUM TC 99M SESTAMIBI GENERIC - CARDIOLITE
30.0000 | Freq: Once | INTRAVENOUS | Status: AC | PRN
  Administered 2013-05-20: 30 via INTRAVENOUS

## 2013-05-20 MED ORDER — TECHNETIUM TC 99M SESTAMIBI - CARDIOLITE
10.0000 | Freq: Once | INTRAVENOUS | Status: AC | PRN
  Administered 2013-05-20: 9.9 via INTRAVENOUS

## 2013-05-20 MED ORDER — REGADENOSON 0.4 MG/5ML IV SOLN
INTRAVENOUS | Status: AC
  Administered 2013-05-20: 0.4 mg via INTRAVENOUS
  Filled 2013-05-20: qty 5

## 2013-05-20 MED ORDER — SODIUM CHLORIDE 0.9 % IJ SOLN
INTRAMUSCULAR | Status: AC
  Administered 2013-05-20: 10 mL via INTRAVENOUS
  Filled 2013-05-20: qty 10

## 2013-05-20 NOTE — Progress Notes (Signed)
Stress Lab Nurses Notes - Alexis Dyer  Alexis Dyer 05/20/2013 Reason for doing test: CAD and DOE Type of test: Marlane HatcherLexiscan  Cardiolite Nurse performing test: Alexis PoissonPhyllis Billingsly, RN Nuclear Medicine Tech: Alexis Dyer Echo Tech: Not Applicable MD performing test: Koneswaran/ K.Lawrence NP Family MD: Alexis Dyer Test explained and consent signed: yes IV started: 22g jelco, Saline lock flushed, No redness or edema and Saline lock from floor Symptoms: chest pressure Treatment/Intervention: None Reason test stopped: protocol completed After recovery IV was: Discontinued via X-ray tech and No redness or edema Patient to return to Nuc. Med at : 11:15 Patient discharged: Home Patient's Condition upon discharge was: stable Comments: During test BP 108/46 & HR 75.  Recovery BP 113/47 & HR 68.  Symptoms resolved in recovery. Alexis MillersPhyllis T Fredrik Dyer

## 2013-05-24 ENCOUNTER — Telehealth: Payer: Self-pay | Admitting: *Deleted

## 2013-05-24 NOTE — Telephone Encounter (Signed)
Notes Recorded by Lesle Chris, LPN on 05/30/7822 at 11:51 AM Left message to return call.

## 2013-05-24 NOTE — Telephone Encounter (Signed)
Message copied by Lesle Chris on Fri May 24, 2013 11:51 AM ------      Message from: Prentice Docker A      Created: Tue May 21, 2013  4:52 PM       Would increase Crestor to 20 mg daily, check LFT's in one month, and repeat lipids in 3 months. ------

## 2013-06-07 MED ORDER — PRAVASTATIN SODIUM 40 MG PO TABS: 40.0000 mg | ORAL_TABLET | Freq: Every evening | ORAL | Status: DC

## 2013-06-07 NOTE — Telephone Encounter (Signed)
Notes Recorded by Lesle Chris, LPN on 03/07/3566 at 1:26 PM Patient notified. Will send new rx to [facility]. Will mail reminder when time to repeat labs.  Notes Recorded by Lesle Chris, LPN on 06/04/6835 at 3:54 PM Left message to return call.  Notes Recorded by Laqueta Linden, MD on 06/05/2013 at 10:15 AM Looks like med list in note doesn't match what she is actually taking. Start pravastatin 40 mg daily, repeat lipids in 3 months, LFT's in 6-8 weeks.  Notes Recorded by Lesle Chris, LPN on 02/11/209 at 5:10 PM Per last OV, patient had stopped the Crestor. Dictation mentions possibly starting her on Pravastatin. Please advise.

## 2013-06-17 ENCOUNTER — Other Ambulatory Visit: Payer: Self-pay | Admitting: Cardiovascular Disease

## 2013-07-01 ENCOUNTER — Encounter: Payer: Self-pay | Admitting: Cardiovascular Disease

## 2013-07-01 ENCOUNTER — Ambulatory Visit (INDEPENDENT_AMBULATORY_CARE_PROVIDER_SITE_OTHER): Payer: Medicare Other | Admitting: Cardiovascular Disease

## 2013-07-01 VITALS — BP 199/65 | HR 64 | Ht 64.5 in | Wt 178.0 lb

## 2013-07-01 DIAGNOSIS — Z79899 Other long term (current) drug therapy: Secondary | ICD-10-CM

## 2013-07-01 DIAGNOSIS — R269 Unspecified abnormalities of gait and mobility: Secondary | ICD-10-CM

## 2013-07-01 DIAGNOSIS — R0989 Other specified symptoms and signs involving the circulatory and respiratory systems: Secondary | ICD-10-CM

## 2013-07-01 DIAGNOSIS — R2689 Other abnormalities of gait and mobility: Secondary | ICD-10-CM

## 2013-07-01 DIAGNOSIS — R5383 Other fatigue: Secondary | ICD-10-CM

## 2013-07-01 DIAGNOSIS — E785 Hyperlipidemia, unspecified: Secondary | ICD-10-CM

## 2013-07-01 DIAGNOSIS — R5381 Other malaise: Secondary | ICD-10-CM

## 2013-07-01 DIAGNOSIS — R0609 Other forms of dyspnea: Secondary | ICD-10-CM

## 2013-07-01 DIAGNOSIS — I25810 Atherosclerosis of coronary artery bypass graft(s) without angina pectoris: Secondary | ICD-10-CM

## 2013-07-01 DIAGNOSIS — I10 Essential (primary) hypertension: Secondary | ICD-10-CM

## 2013-07-01 MED ORDER — HYDRALAZINE HCL 50 MG PO TABS: 50.0000 mg | ORAL_TABLET | Freq: Two times a day (BID) | ORAL | Status: DC

## 2013-07-01 NOTE — Patient Instructions (Signed)
   Increase Hydralazine to 50mg  twice a day - new sent to pharm Continue all other medications.   Follow up in  2 months

## 2013-07-01 NOTE — Progress Notes (Signed)
Patient ID: Alexis Dyer, female   DOB: 1923-07-26, 80 y.o.   MRN: [id number]      SUBJECTIVE: [name] is here to followup on the results of cardiovascular testing performed for the evaluation of progressive dyspnea on exertion and fatigue.  In summary, she is an 80 year old woman who has a h/o CABG in 1996, HTN, hyperlipidemia, and bigeminal and trigeminal PVC's. She reportedly had an echocardiogram in 06/2011 which revealed an EF greater than 55%, and mild to moderate tricuspid regurgitation and mild to moderate pulmonary HTN.  She stopped taking Crestor due to leg cramps, which have since subsided. I started her on pravastatin due to elevated lipids after her last visit.  ECG at last visit demonstrated normal sinus rhythm with a right bundle branch block and nonspecific ST segment abnormality. First degree AV block is also noted with a PR interval of 226 ms.  She had been complaining of dyspnea with exertion which had been going on for several months. Prior to CABG, her symptoms were both chest pain and shortness of breath. She denied chest pain. She said her systolic blood pressures were sometimes as high as 150-160 in the evening but not all the time. Her legs have felt weak for several months as well. She's had a few falls as well but denies syncope.  However, her balance is improving.  I reduced her metoprolol but this did not serve to completely alleviate her symptoms, albeit they have improved. Lexiscan Cardiolite demonstrated a mild degree of anteroapical ischemia. She is also hypertensive but has been noted to have labile blood pressure. BP today 200/71 and 199/65 in both arms. She said systolic readings are in the 130 range when she first wakes up but her blood pressure elevates with any significant exertion.  She also admits to adding salt to her food but wants to cut back. She denies orthopnea and leg swelling.  SocHx: her sister is Camera operatorMary Pulliam.  Nonsmoker.     Allergies  Allergen Reactions  . Crestor [Rosuvastatin] Other (See Comments)    Leg Cramps    Current Outpatient Prescriptions  Medication Sig Dispense Refill  . furosemide (LASIX) 20 MG tablet Take 1 tablet (20 mg total) by mouth daily.  30 tablet  8  . hydrALAZINE (APRESOLINE) 25 MG tablet Take 1 tablet (25 mg total) by mouth 2 (two) times daily.  180 tablet  3  . levothyroxine (SYNTHROID, LEVOTHROID) 100 MCG tablet Take 100 mcg by mouth daily before breakfast.      . metoprolol succinate (TOPROL-XL) 50 MG 24 hr tablet Take one tab (50mg ) by mouth every morning & 1/2 tab (25mg ) every evening  45 tablet  6  . pravastatin (PRAVACHOL) 40 MG tablet Take 1 tablet (40 mg total) by mouth every evening.  30 tablet  6  . RANEXA 500 MG 12 hr tablet TAKE 1 TABLET BY MOUTH 2 TIMES DAILY  60 tablet  6  . aspirin 81 MG tablet Take 81 mg by mouth daily.      . fish oil-omega-3 fatty acids 1000 MG capsule Take 1 g by mouth daily. Includes magnesium and zinc and calcium      . isosorbide mononitrate (IMDUR) 60 MG 24 hr tablet Take 60 mg by mouth every morning. & 30 mg by mouth in the evening      . nitroGLYCERIN (NITROSTAT) 0.4 MG SL tablet Please 1 tablet under the tongue every 5 minutes for up to 3 doses as needed for  chest pain.  25 tablet  4  . valsartan-hydrochlorothiazide (DIOVAN-HCT) 320-25 MG per tablet Take 1 tablet by mouth daily.  30 tablet  9   No current facility-administered medications for this visit.    Past Medical History  Diagnosis Date  . SOB (shortness of breath)   . Hypertension 06/2011    2D Echo EF>55% mild to moderate tricuspid regurgitation and mild to moderate pulmonary hypertension.  . Ischemia 10/2011    stress test EF 67% with no ischemia  . Dyslipidemia   . Hypothyroidism   . CAD (coronary artery disease)     bypass in 1996 2D Echo preformed EF >55%  . Edema 06/22/2011    lower arterial for claudication  . Dyspnea 10/11/2011    stress test EF  67% normal stress test.  . Back pain     Past Surgical History  Procedure Laterality Date  . Coronary artery bypass graft  1996    LIMA to the obtuse marginal, saphenous vein graft to the LAD, saphenous vein graft to the obtuse marginal-1, saphenous vien graft to the posterior diagonal artery performed by Dr Alean RinneMichael High  . Abdominal hysterectomy    . Cardiac catheterization  11/16/2006  . Cardiac catheterization  04/04/2001    History   Social History  . Marital Status: Widowed    Spouse Name: N/A    Number of Children: N/A  . Years of Education: N/A   Occupational History  . Not on file.   Social History Main Topics  . Smoking status: Never Smoker   . Smokeless tobacco: Never Used  . Alcohol Use: Not on file  . Drug Use: Not on file  . Sexual Activity: Not on file   Other Topics Concern  . Not on file   Social History Narrative  . No narrative on file     Filed Vitals:   07/01/13 0927  Height: 5' 4.5" (1.638 m)  Weight: 178 lb (80.74 kg)   BP 200/71 199/65  Pulse 72    PHYSICAL EXAM General: NAD Neck: No JVD, no thyromegaly. Lungs: Clear to auscultation bilaterally with normal respiratory effort. CV: Nondisplaced PMI.  Regular rate and rhythm, normal S1/S2, no S3/S4, no murmur. No pretibial or periankle edema.  No carotid bruit.  Normal pedal pulses.  Abdomen: Soft, nontender, no hepatosplenomegaly, no distention.  Neurologic: Alert and oriented x 3.  Psych: Normal affect. Extremities: No clubbing or cyanosis.   ECG: reviewed and available in electronic records.      ASSESSMENT AND PLAN: 1. Dyspnea with exertion, in the setting of CAD and CABG: Given her continued complaints of dyspnea with exertion (had similar symptoms prior to CABG), and the mild degree of ischemia seen with Lexiscan Cardiolite, I discussed the eventual possibility of coronary angiography. For the time being, given that there has been some degree of symptoms improvement, I will  continue ASA, Ranexa, and Imdur. Decreasing her metoprolol dose did not serve to completely alleviate her symptoms at her last visit (HR 59 bpm at that time). 2. HTN: Presently uncontrolled. Continue Diovan-HCT and increase hydralazine to 50 mg bid. She did not tolerate amlodipine. I strongly encouraged her to decrease sodium intake. 3. Hyperlipidemia: Continue pravastatin. Await repeat lipids in 2 months. 4. Balance issues: Will see if controlling her blood pressure will help to control these symptoms.  Dispo: f/u 2 months.   Prentice DockerSuresh Koneswaran, M.D., F.A.C.C.

## 2013-07-03 ENCOUNTER — Telehealth: Payer: Self-pay | Admitting: *Deleted

## 2013-07-03 MED ORDER — HYDRALAZINE HCL 25 MG PO TABS: 37.5000 mg | ORAL_TABLET | Freq: Two times a day (BID) | ORAL | Status: DC

## 2013-07-03 NOTE — Telephone Encounter (Signed)
Patient notified.  Advised to call back if no improvement.

## 2013-07-03 NOTE — Telephone Encounter (Signed)
Hydralazine was recently increased on 07/01/2013 to 50mg  twice a day.   6/29 - started feeling bad that night, the following morning felt weak, legs trembling 6/30 - last pm felt like she had chills all night  No chest pain, dizziness, or SOB - just feels weak & give out.  Did not take this morning & states she is feeling a little better since she did not take this morning.  Was previously on 25mg  twice a day.

## 2013-07-03 NOTE — Telephone Encounter (Signed)
Can see if she can tolerate 37.5 mg bid, as her BP is very high and controlling it may help to control her symptoms.

## 2013-07-17 ENCOUNTER — Ambulatory Visit: Payer: Medicare Other | Admitting: Cardiovascular Disease

## 2013-08-12 ENCOUNTER — Encounter (INDEPENDENT_AMBULATORY_CARE_PROVIDER_SITE_OTHER): Payer: PRIVATE HEALTH INSURANCE | Admitting: Ophthalmology

## 2013-09-05 ENCOUNTER — Encounter: Payer: Self-pay | Admitting: *Deleted

## 2013-09-05 ENCOUNTER — Encounter: Payer: Self-pay | Admitting: Cardiovascular Disease

## 2013-09-05 ENCOUNTER — Ambulatory Visit (INDEPENDENT_AMBULATORY_CARE_PROVIDER_SITE_OTHER): Payer: Medicare Other | Admitting: Cardiovascular Disease

## 2013-09-05 ENCOUNTER — Other Ambulatory Visit: Payer: Self-pay | Admitting: Cardiovascular Disease

## 2013-09-05 VITALS — BP 185/70 | HR 60 | Ht 64.0 in | Wt 179.0 lb

## 2013-09-05 DIAGNOSIS — R490 Dysphonia: Secondary | ICD-10-CM

## 2013-09-05 DIAGNOSIS — E785 Hyperlipidemia, unspecified: Secondary | ICD-10-CM

## 2013-09-05 DIAGNOSIS — R0609 Other forms of dyspnea: Secondary | ICD-10-CM

## 2013-09-05 DIAGNOSIS — R0989 Other specified symptoms and signs involving the circulatory and respiratory systems: Secondary | ICD-10-CM

## 2013-09-05 DIAGNOSIS — R29898 Other symptoms and signs involving the musculoskeletal system: Secondary | ICD-10-CM

## 2013-09-05 DIAGNOSIS — I10 Essential (primary) hypertension: Secondary | ICD-10-CM

## 2013-09-05 DIAGNOSIS — I25810 Atherosclerosis of coronary artery bypass graft(s) without angina pectoris: Secondary | ICD-10-CM

## 2013-09-05 DIAGNOSIS — Z79899 Other long term (current) drug therapy: Secondary | ICD-10-CM

## 2013-09-05 DIAGNOSIS — I739 Peripheral vascular disease, unspecified: Secondary | ICD-10-CM

## 2013-09-05 MED ORDER — HYDRALAZINE HCL 50 MG PO TABS: ORAL_TABLET | ORAL | Status: DC

## 2013-09-05 NOTE — Patient Instructions (Addendum)
   Increase Hydralazine to  three times per day. New sent to pharmacy. Continue all other medications.   Labs for Lipids, LFTs Your physician has requested that you have an ankle brachial index (ABI). During this test an ultrasound and blood pressure cuff are used to evaluate the arteries that supply the arms and legs with blood. Allow thirty minutes for this exam. There are no restrictions or special instructions. Office will contact with results via phone or letter.   Your physician wants you to follow up in: 6 months.  You will receive a reminder letter in the mail one-two months in advance.  If you don't receive a letter, please call our office to schedule the follow up appointment

## 2013-09-05 NOTE — Progress Notes (Signed)
Patient ID: Alexis Dyer, female   DOB: 10/29/1923, 80 y.o.   MRN: [id number]      SUBJECTIVE: Mrs. Zavaleta is here for routine cardiovascular follow up. In summary, she is an 80 year old woman who has a h/o CABG in 1996, HTN, hyperlipidemia, and bigeminal and trigeminal PVC's. She reportedly had an echocardiogram in 06/2011 which revealed an EF greater than 55%, and mild to moderate tricuspid regurgitation and mild to moderate pulmonary HTN.  She previously stopped taking Crestor due to leg cramps. I started her on pravastatin due to elevated lipids at a prior visit.  ECG at a prior visit demonstrated normal sinus rhythm with a right bundle branch block and nonspecific ST segment abnormality. First degree AV block was also noted with a PR interval of 226 ms.   She had been complaining of dyspnea with exertion which had been going on for several months. Prior to CABG, her symptoms were both chest pain and shortness of breath. Lexiscan Cardiolite demonstrated a mild degree of anteroapical ischemia in 05/2013. She is noted to have labile blood pressure.   She said her dyspnea on exertion and fatigue have improved. Her primary complaints related to bilateral leg weakness and fatigue. She denies claudication pain. She denies chest pain and palpitations. She also complains of a hoarse voice after taking medications which resolves by 8 in the evening. Systolic blood pressures at home have ranged from 134 up to 160 mm mercury.  SocHx: Her sister is Domingo Dimes who is also my patient. Nonsmoker.    Review of Systems: As per "subjective", otherwise negative.  Allergies  Allergen Reactions  . Crestor [Rosuvastatin] Other (See Comments)    Leg Cramps    Current Outpatient Prescriptions  Medication Sig Dispense Refill  . aspirin 81 MG tablet Take 81 mg by mouth daily.      . fish oil-omega-3 fatty acids 1000 MG capsule Take 1 g by mouth daily. Includes magnesium and zinc and calcium      .  furosemide (LASIX) 20 MG tablet Take 1 tablet (20 mg total) by mouth daily.  30 tablet  8  . isosorbide mononitrate (IMDUR) 60 MG 24 hr tablet Take 60 mg by mouth every morning. & 30 mg by mouth in the evening      . levothyroxine (SYNTHROID, LEVOTHROID) 100 MCG tablet Take 100 mcg by mouth daily before breakfast.      . nitroGLYCERIN (NITROSTAT) 0.4 MG SL tablet Please 1 tablet under the tongue every 5 minutes for up to 3 doses as needed for chest pain.  25 tablet  4  . pravastatin (PRAVACHOL) 40 MG tablet Take 1 tablet (40 mg total) by mouth every evening.  30 tablet  6  . RANEXA 500 MG 12 hr tablet TAKE 1 TABLET BY MOUTH 2 TIMES DAILY  60 tablet  6  . valsartan-hydrochlorothiazide (DIOVAN-HCT) 320-25 MG per tablet Take 1 tablet by mouth daily.  30 tablet  9   No current facility-administered medications for this visit.    Past Medical History  Diagnosis Date  . SOB (shortness of breath)   . Hypertension 06/2011    2D Echo EF>55% mild to moderate tricuspid regurgitation and mild to moderate pulmonary hypertension.  . Ischemia 10/2011    stress test EF 67% with no ischemia  . Dyslipidemia   . Hypothyroidism   . CAD (coronary artery disease)     bypass in 1996 2D Echo preformed EF >55%  . Edema 06/22/2011  lower arterial for claudication  . Dyspnea 10/11/2011    stress test EF 67% normal stress test.  . Back pain     Past Surgical History  Procedure Laterality Date  . Coronary artery bypass graft  1996    LIMA to the obtuse marginal, saphenous vein graft to the LAD, saphenous vein graft to the obtuse marginal-1, saphenous vien graft to the posterior diagonal artery performed by Dr Alean Rinne  . Abdominal hysterectomy    . Cardiac catheterization  11/16/2006  . Cardiac catheterization  04/04/2001    History   Social History  . Marital Status: Widowed    Spouse Name: N/A    Number of Children: N/A  . Years of Education: N/A   Occupational History  . Not on file.    Social History Main Topics  . Smoking status: Never Smoker   . Smokeless tobacco: Never Used  . Alcohol Use: Not on file  . Drug Use: Not on file  . Sexual Activity: Not on file   Other Topics Concern  . Not on file   Social History Narrative  . No narrative on file     Filed Vitals:   09/05/13 1122  Weight: 179 lb (81.194 kg)   BP 185/70 Pulse 60    PHYSICAL EXAM General: NAD HEENT: Normal. Neck: No JVD, no thyromegaly. Lungs: Clear to auscultation bilaterally with normal respiratory effort. CV: Nondisplaced PMI.  Regular rate and rhythm, normal S1/S2, no S3/S4, no murmur. No pretibial or periankle edema.  No carotid bruit.  Normal pedal pulses.  Abdomen: Soft, nontender, no hepatosplenomegaly, no distention.  Neurologic: Alert and oriented x 3.  Psych: Normal affect. Skin: Normal. Musculoskeletal: Normal range of motion, no gross deformities. Extremities: No clubbing or cyanosis.   ECG: Most recent ECG reviewed.      ASSESSMENT AND PLAN: 1. Dyspnea with exertion, in the setting of CAD and CABG: Symptoms have improved, and this may be a reflection of better (albeit suboptimal) BP control. I will continue ASA, metoprolol, pravastatin, Ranexa, and Imdur.  2. Essential HTN: Presently uncontrolled. Continue Diovan-HCT and increase hydralazine to 75 mg bid. She did not tolerate amlodipine. 3. Hyperlipidemia: Continue pravastatin. Check lipid panel and LFT's. 4. Bilateral leg weakness and fatiuge: Given her h/o CABG, she has a propensity for PVD. Will check ABI's. 5. Hoarseness of voice: Recommended conservative therapy with hot tea and honey.  Dispo: f/u 6 months.   Prentice Docker, M.D., F.A.C.C.

## 2013-09-06 LAB — HEPATIC FUNCTION PANEL
ALT: 13 U/L (ref 0–35)
AST: 16 U/L (ref 0–37)
Albumin: 4.1 g/dL (ref 3.5–5.2)
Alkaline Phosphatase: 41 U/L (ref 39–117)
Bilirubin, Direct: 0.1 mg/dL (ref 0.0–0.3)
Indirect Bilirubin: 0.5 mg/dL (ref 0.2–1.2)
TOTAL PROTEIN: 6.5 g/dL (ref 6.0–8.3)
Total Bilirubin: 0.6 mg/dL (ref 0.2–1.2)

## 2013-09-06 LAB — LIPID PANEL
Cholesterol: 211 mg/dL — ABNORMAL HIGH (ref 0–200)
HDL: 43 mg/dL (ref >39)
LDL Cholesterol: 128 mg/dL — ABNORMAL HIGH (ref 0–99)
TRIGLYCERIDES: 200 mg/dL — AB (ref <150)
Total CHOL/HDL Ratio: 4.9 Ratio
VLDL: 40 mg/dL (ref 0–40)

## 2013-09-11 ENCOUNTER — Telehealth: Payer: Self-pay | Admitting: *Deleted

## 2013-09-11 MED ORDER — PRAVASTATIN SODIUM 80 MG PO TABS: 80.0000 mg | ORAL_TABLET | Freq: Every day | ORAL | Status: DC

## 2013-09-11 NOTE — Telephone Encounter (Signed)
Notes Recorded by Lesle Chris, LPN on 01/08/1094 at 10:25 AM Patient notified. Will send new rx to [facility]. Will mail reminder when time to repeat labs.

## 2013-09-11 NOTE — Telephone Encounter (Signed)
Message copied by Lesle Chris on Wed Sep 11, 2013 10:26 AM ------      Message from: Prentice Docker A      Created: Wed Sep 11, 2013  9:11 AM       Increase pravastatin to 80 mg and repeat lipids/lft's in 3 months. ------

## 2013-09-12 ENCOUNTER — Ambulatory Visit (INDEPENDENT_AMBULATORY_CARE_PROVIDER_SITE_OTHER): Payer: Medicare Other | Admitting: Cardiology

## 2013-09-12 DIAGNOSIS — I739 Peripheral vascular disease, unspecified: Secondary | ICD-10-CM

## 2013-09-12 NOTE — Progress Notes (Signed)
Lower extremity arterial Doppler performed. 

## 2013-09-13 ENCOUNTER — Telehealth: Payer: Self-pay | Admitting: *Deleted

## 2013-09-13 NOTE — Telephone Encounter (Signed)
Message copied by Jerrye Beavers on Fri Sep 13, 2013  2:09 PM ------      Message from: Prentice Docker A      Created: Fri Sep 13, 2013 12:31 PM       Stable. ------

## 2013-09-13 NOTE — Telephone Encounter (Signed)
Left message to return call 

## 2013-09-13 NOTE — Telephone Encounter (Signed)
Results given to patient

## 2013-11-07 ENCOUNTER — Encounter: Payer: Self-pay | Admitting: Cardiovascular Disease

## 2013-11-08 ENCOUNTER — Encounter: Payer: Self-pay | Admitting: Cardiovascular Disease

## 2013-12-03 ENCOUNTER — Other Ambulatory Visit: Payer: Self-pay | Admitting: Cardiovascular Disease

## 2013-12-05 ENCOUNTER — Encounter: Payer: Self-pay | Admitting: Cardiovascular Disease

## 2013-12-05 ENCOUNTER — Ambulatory Visit (INDEPENDENT_AMBULATORY_CARE_PROVIDER_SITE_OTHER): Payer: Medicare Other | Admitting: Cardiovascular Disease

## 2013-12-05 ENCOUNTER — Other Ambulatory Visit: Payer: Self-pay | Admitting: Cardiovascular Disease

## 2013-12-05 ENCOUNTER — Other Ambulatory Visit: Payer: Self-pay | Admitting: *Deleted

## 2013-12-05 VITALS — BP 138/69 | HR 71 | Ht 64.0 in | Wt 171.0 lb

## 2013-12-05 DIAGNOSIS — I10 Essential (primary) hypertension: Secondary | ICD-10-CM

## 2013-12-05 DIAGNOSIS — Z9289 Personal history of other medical treatment: Secondary | ICD-10-CM

## 2013-12-05 DIAGNOSIS — I25810 Atherosclerosis of coronary artery bypass graft(s) without angina pectoris: Secondary | ICD-10-CM

## 2013-12-05 DIAGNOSIS — Z87898 Personal history of other specified conditions: Secondary | ICD-10-CM

## 2013-12-05 DIAGNOSIS — E785 Hyperlipidemia, unspecified: Secondary | ICD-10-CM

## 2013-12-05 MED ORDER — METOPROLOL SUCCINATE ER 50 MG PO TB24: 50.0000 mg | ORAL_TABLET | Freq: Every day | ORAL | Status: DC

## 2013-12-05 NOTE — Patient Instructions (Signed)

## 2013-12-05 NOTE — Progress Notes (Signed)
Patient ID: Alexis Dyer, female   DOB: August 31, 1923, 80 y.o.   MRN: [id number]      SUBJECTIVE: The patient returns for follow up after being [facility]. Her chief complaint was "I feel like jerking inside ". I reviewed all the documentation from this hospitalization. CBC demonstrated hemoglobin of 10.2 and platelets of 152. BUN 22, creatinine 0.98. Troponins were 0.01, 0.18, 0.28, and 0.13. Her blood pressure was reportedly elevated and both Ranexa and hydralazine were discontinued, and she was restarted on amlodipine. However, she appears to no longer be taking this. Her mild troponin leak was felt secondary to coronary artery disease and hypertension and relative renal insufficiency. Blood pressure on the day of discharge was 132/55 with a pulse of 56 bpm. She was evaluated by a cardiologist, Dr. Molly Madurolevenger, with [facility].  She tells me she had an episodic "shivering sensation" which would come on without warning prior to her hospitalization. Since then, it has very seldom occurred. She has episodic chills. She denies chest pain, palpitations, and shortness of breath.     Review of Systems: As per "subjective", otherwise negative.  Allergies  Allergen Reactions  . Crestor [Rosuvastatin] Other (See Comments)    Leg Cramps    Current Outpatient Prescriptions  Medication Sig Dispense Refill  . aspirin 81 MG tablet Take 81 mg by mouth daily.    . fish oil-omega-3 fatty acids 1000 MG capsule Take 1 g by mouth daily. Includes magnesium and zinc and calcium    . furosemide (LASIX) 20 MG tablet Take 1 tablet (20 mg total) by mouth daily. 30 tablet 8  . hydrALAZINE (APRESOLINE) 50 MG tablet Take 1 1/2 tablet three times a day. 135 tablet 6  . isosorbide mononitrate (IMDUR) 60 MG 24 hr tablet Take 60 mg by mouth every morning. & 30 mg by mouth in the evening    . levothyroxine (SYNTHROID, LEVOTHROID) 100 MCG tablet Take 100 mcg by mouth daily before breakfast.     . metoprolol succinate (TOPROL-XL) 100 MG 24 hr tablet TAKE 1 AND 1/2 TABLET BY MOUTH DAILY , TAKE WITH OR IMMEDIATELY FOLLOWING A MEAL (PER PT SHE TAKES 1/2 AND THEN 1/4 PM) 60 tablet 6  . nitroGLYCERIN (NITROSTAT) 0.4 MG SL tablet Please 1 tablet under the tongue every 5 minutes for up to 3 doses as needed for chest pain. 25 tablet 4  . pravastatin (PRAVACHOL) 80 MG tablet Take 1 tablet (80 mg total) by mouth daily. 30 tablet 6  . RANEXA 500 MG 12 hr tablet TAKE 1 TABLET BY MOUTH 2 TIMES DAILY 60 tablet 6  . valsartan-hydrochlorothiazide (DIOVAN-HCT) 320-25 MG per tablet Take 1 tablet by mouth daily. 30 tablet 9   No current facility-administered medications for this visit.    Past Medical History  Diagnosis Date  . SOB (shortness of breath)   . Hypertension 06/2011    2D Echo EF>55% mild to moderate tricuspid regurgitation and mild to moderate pulmonary hypertension.  . Ischemia 10/2011    stress test EF 67% with no ischemia  . Dyslipidemia   . Hypothyroidism   . CAD (coronary artery disease)     bypass in 1996 2D Echo preformed EF >55%  . Edema 06/22/2011    lower arterial for claudication  . Dyspnea 10/11/2011    stress test EF 67% normal stress test.  . Back pain     Past Surgical History  Procedure Laterality Date  . Coronary artery bypass graft  1996    LIMA to the obtuse marginal, saphenous vein graft to the LAD, saphenous vein graft to the obtuse marginal-1, saphenous vien graft to the posterior diagonal artery performed by Dr Alean RinneMichael High  . Abdominal hysterectomy    . Cardiac catheterization  11/16/2006  . Cardiac catheterization  04/04/2001    History   Social History  . Marital Status: Widowed    Spouse Name: N/A    Number of Children: N/A  . Years of Education: N/A   Occupational History  . Not on file.   Social History Main Topics  . Smoking status: Never Smoker   . Smokeless tobacco: Never Used  . Alcohol Use: Not on file  . Drug Use: Not on  file  . Sexual Activity: Not on file   Other Topics Concern  . Not on file   Social History Narrative  . No narrative on file     Filed Vitals:   12/05/13 1126  Weight: 171 lb (77.565 kg)   BP 138/69  Pulse 71  SpO2 98%   PHYSICAL EXAM General: NAD HEENT: Normal. Neck: No JVD, no thyromegaly. Lungs: Clear to auscultation bilaterally with normal respiratory effort. CV: Nondisplaced PMI.  Regular rate and rhythm, normal S1/S2, no S3/S4, no murmur. No pretibial or periankle edema.  No carotid bruit.  Normal pedal pulses.  Abdomen: Soft, nontender, no hepatosplenomegaly, no distention.  Neurologic: Alert and oriented x 3.  Psych: Normal affect. Skin: Normal. Musculoskeletal: Normal range of motion, no gross deformities. Extremities: No clubbing or cyanosis.   ECG: Most recent ECG reviewed.      ASSESSMENT AND PLAN: 1. CAD and CABG: Stable  Ischemic heart disease. Continue aspirin, Imdur 60 mg, pravastatin 80 mg, and Toprol-XL 50 mg daily. 2. Essential HTN: Well controlled. Continue present therapy. 3. Hyperlipidemia: Continue pravastatin 80 mg. LDL 128 in 09/2013. Check lipid panel and LFT's in next several weeks.  Dispo: f/u 6 months.  Prentice DockerSuresh Emilya Justen, M.D., F.A.C.C.

## 2014-01-31 ENCOUNTER — Other Ambulatory Visit: Payer: Self-pay | Admitting: Cardiovascular Disease

## 2014-01-31 ENCOUNTER — Telehealth: Payer: Self-pay | Admitting: *Deleted

## 2014-01-31 MED ORDER — METOPROLOL SUCCINATE ER 50 MG PO TB24: 50.0000 mg | ORAL_TABLET | Freq: Every day | ORAL | Status: DC

## 2014-01-31 NOTE — Telephone Encounter (Signed)
[facility] called and stated they never received refill for metoprolol. Our records state it was confirmed by pharmacy on 01/01/2014. [facility]. Medication sent to pharmacy.

## 2014-05-01 ENCOUNTER — Other Ambulatory Visit: Payer: Self-pay | Admitting: *Deleted

## 2014-05-01 MED ORDER — PRAVASTATIN SODIUM 80 MG PO TABS: 80.0000 mg | ORAL_TABLET | Freq: Every day | ORAL | Status: DC

## 2014-05-08 ENCOUNTER — Encounter: Payer: Self-pay | Admitting: Cardiovascular Disease

## 2014-05-08 ENCOUNTER — Ambulatory Visit (INDEPENDENT_AMBULATORY_CARE_PROVIDER_SITE_OTHER): Payer: Medicare Other | Admitting: Cardiovascular Disease

## 2014-05-08 ENCOUNTER — Telehealth: Payer: Self-pay | Admitting: Cardiovascular Disease

## 2014-05-08 ENCOUNTER — Other Ambulatory Visit: Payer: Self-pay | Admitting: Cardiovascular Disease

## 2014-05-08 ENCOUNTER — Encounter: Payer: Self-pay | Admitting: *Deleted

## 2014-05-08 VITALS — BP 150/74 | HR 48 | Ht 64.0 in | Wt 185.0 lb

## 2014-05-08 DIAGNOSIS — I739 Peripheral vascular disease, unspecified: Secondary | ICD-10-CM

## 2014-05-08 DIAGNOSIS — I10 Essential (primary) hypertension: Secondary | ICD-10-CM | POA: Diagnosis not present

## 2014-05-08 DIAGNOSIS — I25709 Atherosclerosis of coronary artery bypass graft(s), unspecified, with unspecified angina pectoris: Secondary | ICD-10-CM

## 2014-05-08 DIAGNOSIS — R931 Abnormal findings on diagnostic imaging of heart and coronary circulation: Secondary | ICD-10-CM

## 2014-05-08 DIAGNOSIS — R5383 Other fatigue: Secondary | ICD-10-CM | POA: Diagnosis not present

## 2014-05-08 DIAGNOSIS — I498 Other specified cardiac arrhythmias: Secondary | ICD-10-CM

## 2014-05-08 DIAGNOSIS — E785 Hyperlipidemia, unspecified: Secondary | ICD-10-CM

## 2014-05-08 DIAGNOSIS — R0609 Other forms of dyspnea: Secondary | ICD-10-CM | POA: Diagnosis not present

## 2014-05-08 NOTE — Progress Notes (Signed)
Patient ID: Alexis Dyer, female   DOB: 09/01/1923, 81 y.o.   MRN: [id number]      SUBJECTIVE: The patient returns for routine follow-up for coronary artery disease with a history of CABG as well as hypertension and hyperlipidemia. Nuclear stress test on 05/21/13 demonstrated a mild degree of anteroapical ischemia with overlying anterior wall soft tissue attenuation artifact also noted. LVEF 62%. At that time I had mentioned the possibility of coronary angiography but this was not pursued.  She has been feeling very short of breath. She saw her primary care physician yesterday obtain labs which demonstrated hemoglobin 11.1, BUN 30, creatinine 0.86, TSH 1.45. She wonders if she is having reactions to pravastatin. Her blood pressure is 150/74 today. She checked it at [facility] yesterday and systolic was 135 mmHg.  ECG performed in the office today demonstrates sinus rhythm with first-degree AV block, PR interval 234 ms, with PACs and an underlying right bundle branch block, heart rate 73 bpm.   Review of Systems: As per "subjective", otherwise negative.  Allergies  Allergen Reactions  . Crestor [Rosuvastatin] Other (See Comments)    Leg Cramps    Current Outpatient Prescriptions  Medication Sig Dispense Refill  . ALPRAZolam (XANAX) 0.25 MG tablet Take 0.25 mg by mouth every 8 (eight) hours as needed for anxiety.    . amLODipine (NORVASC) 2.5 MG tablet Take 2.5 mg by mouth daily.     . aspirin 81 MG tablet Take 81 mg by mouth daily.    . fish oil-omega-3 fatty acids 1000 MG capsule Take 1 g by mouth daily. Includes magnesium and zinc and calcium    . furosemide (LASIX) 20 MG tablet Take 1 tablet (20 mg total) by mouth daily. 30 tablet 8  . isosorbide mononitrate (IMDUR) 60 MG 24 hr tablet Take 60 mg by mouth 2 (two) times daily. 1 AM 1/2 tab PM    . levothyroxine (SYNTHROID, LEVOTHROID) 100 MCG tablet Take 100 mcg by mouth daily before breakfast.    . metoprolol succinate (TOPROL-XL) 50  MG 24 hr tablet Take 1 tablet (50 mg total) by mouth daily. Take with or immediately following a meal. 30 tablet 6  . nitroGLYCERIN (NITROSTAT) 0.4 MG SL tablet Please 1 tablet under the tongue every 5 minutes for up to 3 doses as needed for chest pain. 25 tablet 4  . pravastatin (PRAVACHOL) 80 MG tablet Take 1 tablet (80 mg total) by mouth daily. 30 tablet 1  . valsartan-hydrochlorothiazide (DIOVAN-HCT) 320-25 MG per tablet Take 1 tablet by mouth daily. 30 tablet 9   No current facility-administered medications for this visit.    Past Medical History  Diagnosis Date  . SOB (shortness of breath)   . Hypertension 06/2011    2D Echo EF>55% mild to moderate tricuspid regurgitation and mild to moderate pulmonary hypertension.  . Ischemia 10/2011    stress test EF 67% with no ischemia  . Dyslipidemia   . Hypothyroidism   . CAD (coronary artery disease)     bypass in 1996 2D Echo preformed EF >55%  . Edema 06/22/2011    lower arterial for claudication  . Dyspnea 10/11/2011    stress test EF 67% normal stress test.  . Back pain     Past Surgical History  Procedure Laterality Date  . Coronary artery bypass graft  1996    LIMA to the obtuse marginal, saphenous vein graft to the LAD, saphenous vein graft to the obtuse marginal-1, saphenous vien graft to the   posterior diagonal artery performed by Dr Alean RinneMichael High  . Abdominal hysterectomy    . Cardiac catheterization  11/16/2006  . Cardiac catheterization  04/04/2001    History   Social History  . Marital Status: Widowed    Spouse Name: N/A  . Number of Children: N/A  . Years of Education: N/A   Occupational History  . Not on file.   Social History Main Topics  . Smoking status: Never Smoker   . Smokeless tobacco: Never Used  . Alcohol Use: No  . Drug Use: No  . Sexual Activity: Not on file   Other Topics Concern  . Not on file   Social History Narrative     Filed Vitals:   05/08/14 1342  BP: 150/74  Pulse: 48    Height: 5\' 4"  (1.626 m)  Weight: 185 lb (83.915 kg)  SpO2: 98%   HR 73 bpm by ECG with PAC's.  PHYSICAL EXAM General: NAD HEENT: Normal. Neck: No JVD, no thyromegaly. Lungs: Faint expiratory wheezes with normal respiratory effort. CV: Bradycardic, irregular hythm, normal S1/S2, no S3/S4, no murmur. No pretibial or periankle edema.    Abdomen: Soft, nontender, no distention.  Neurologic: Alert and oriented x 3.  Psych: Normal affect. Skin: Normal. Musculoskeletal: No gross deformities. Extremities: No clubbing or cyanosis.   ECG: Most recent ECG reviewed.      ASSESSMENT AND PLAN: 1. Shortness of breath in context of CAD and CABG: Given her shortness of breath and the abnormality seen on nuclear MPI study in 05/2013, I am concerned about ischemic heart disease being the etiology. Will arrange for coronary angiography next week. Continue aspirin, metoprolol, and Imdur 60 mg. Given her preference, I will d/c pravastatin.  2. Essential HTN: Mildly elevated on present therapy. No changes today. Will monitor given advanced age.  3. Hyperlipidemia: LDL 128 in 09/2013. Will stop pravastatin per patient preference.  Dispo: f/u after cath.  Time spent: 40 minutes, of which greater than 50% was spent reviewing symptoms, relevant blood tests and studies, and discussing management plan with the patient.   Prentice DockerSuresh Makhai Fulco, M.D., F.A.C.C.

## 2014-05-08 NOTE — Telephone Encounter (Signed)
Left heart cath - Monday, 5/9 - 12:00 noon - [facility]  Checking percert

## 2014-05-08 NOTE — Telephone Encounter (Signed)
[employer] WUJW#[id number] EXP 06-22-14

## 2014-05-08 NOTE — Patient Instructions (Signed)
   Stop Pravastatin  Continue all other medications.   Your physician has requested that you have a cardiac catheterization. Cardiac catheterization is used to diagnose and/or treat various heart conditions. Doctors may recommend this procedure for a number of different reasons. The most common reason is to evaluate chest pain. Chest pain can be a symptom of coronary artery disease (CAD), and cardiac catheterization can show whether plaque is narrowing or blocking your heart's arteries. This procedure is also used to evaluate the valves, as well as measure the blood flow and oxygen levels in different parts of your heart. For further information please visit [email or web address] Please follow instruction sheet, as given. Follow up will be given at time of discharge from above.

## 2014-05-12 ENCOUNTER — Encounter (HOSPITAL_COMMUNITY): Payer: Self-pay | Admitting: Cardiovascular Disease

## 2014-05-12 ENCOUNTER — Ambulatory Visit (HOSPITAL_COMMUNITY)
Admission: RE | Admit: 2014-05-12 | Discharge: 2014-05-12 | Disposition: A | Discharge status: Home / Self Care | Payer: Medicare Other | Source: Ambulatory Visit | Attending: Cardiovascular Disease | Admitting: Cardiovascular Disease

## 2014-05-12 ENCOUNTER — Encounter (HOSPITAL_COMMUNITY)
Admission: RE | Disposition: A | Discharge status: Home / Self Care | Payer: Medicare Other | Source: Ambulatory Visit | Attending: Cardiovascular Disease

## 2014-05-12 DIAGNOSIS — I10 Essential (primary) hypertension: Secondary | ICD-10-CM | POA: Insufficient documentation

## 2014-05-12 DIAGNOSIS — E785 Hyperlipidemia, unspecified: Secondary | ICD-10-CM | POA: Insufficient documentation

## 2014-05-12 DIAGNOSIS — I2510 Atherosclerotic heart disease of native coronary artery without angina pectoris: Secondary | ICD-10-CM | POA: Insufficient documentation

## 2014-05-12 DIAGNOSIS — R931 Abnormal findings on diagnostic imaging of heart and coronary circulation: Secondary | ICD-10-CM

## 2014-05-12 DIAGNOSIS — Z951 Presence of aortocoronary bypass graft: Secondary | ICD-10-CM | POA: Diagnosis not present

## 2014-05-12 DIAGNOSIS — I25709 Atherosclerosis of coronary artery bypass graft(s), unspecified, with unspecified angina pectoris: Secondary | ICD-10-CM

## 2014-05-12 HISTORY — PX: CARDIAC CATHETERIZATION: SHX172

## 2014-05-12 LAB — CBC
HEMATOCRIT: 36.9 % (ref 36.0–46.0)
Hemoglobin: 11.7 g/dL — ABNORMAL LOW (ref 12.0–15.0)
MCH: 29.3 pg (ref 26.0–34.0)
MCHC: 31.7 g/dL (ref 30.0–36.0)
MCV: 92.3 fL (ref 78.0–100.0)
Platelets: 157 10*3/uL (ref 150–400)
RBC: 4 MIL/uL (ref 3.87–5.11)
RDW: 14.6 % (ref 11.5–15.5)
WBC: 5.3 10*3/uL (ref 4.0–10.5)

## 2014-05-12 LAB — BASIC METABOLIC PANEL
ANION GAP: 8 (ref 5–15)
BUN: 23 mg/dL — AB (ref 6–20)
CO2: 25 mmol/L (ref 22–32)
Calcium: 9.3 mg/dL (ref 8.9–10.3)
Chloride: 106 mmol/L (ref 101–111)
Creatinine, Ser: 0.87 mg/dL (ref 0.44–1.00)
GFR, EST NON AFRICAN AMERICAN: 58 mL/min — AB (ref >60)
Glucose, Bld: 117 mg/dL — ABNORMAL HIGH (ref 70–99)
POTASSIUM: 4.6 mmol/L (ref 3.5–5.1)
Sodium: 139 mmol/L (ref 135–145)

## 2014-05-12 LAB — PROTIME-INR
INR: 1.08 (ref 0.00–1.49)
Prothrombin Time: 14.2 seconds (ref 11.6–15.2)

## 2014-05-12 SURGERY — LEFT HEART CATH AND CORS/GRAFTS ANGIOGRAPHY
Anesthesia: LOCAL

## 2014-05-12 MED ORDER — SODIUM CHLORIDE 0.9 % IV SOLN: 250.0000 mL | INTRAVENOUS | Status: DC | PRN

## 2014-05-12 MED ORDER — FENTANYL CITRATE (PF) 100 MCG/2ML IJ SOLN
INTRAMUSCULAR | Status: AC
  Filled 2014-05-12: qty 2

## 2014-05-12 MED ORDER — SODIUM CHLORIDE 0.9 % IJ SOLN: 3.0000 mL | Freq: Two times a day (BID) | INTRAMUSCULAR | Status: DC

## 2014-05-12 MED ORDER — HYDRALAZINE HCL 20 MG/ML IJ SOLN
INTRAMUSCULAR | Status: AC
Start: 2014-05-12 — End: 2014-05-12
  Filled 2014-05-12: qty 1

## 2014-05-12 MED ORDER — SODIUM CHLORIDE 0.9 % WEIGHT BASED INFUSION
3.0000 mL/kg/h | INTRAVENOUS | Status: DC
  Administered 2014-05-12: 3 mL/kg/h via INTRAVENOUS

## 2014-05-12 MED ORDER — FENTANYL CITRATE (PF) 100 MCG/2ML IJ SOLN
INTRAMUSCULAR | Status: DC | PRN
  Administered 2014-05-12: 50 ug via INTRAVENOUS

## 2014-05-12 MED ORDER — MIDAZOLAM HCL 2 MG/2ML IJ SOLN
INTRAMUSCULAR | Status: AC
  Filled 2014-05-12: qty 2

## 2014-05-12 MED ORDER — ASPIRIN 81 MG PO CHEW: 81.0000 mg | CHEWABLE_TABLET | ORAL | Status: DC

## 2014-05-12 MED ORDER — SODIUM CHLORIDE 0.9 % IJ SOLN: 3.0000 mL | INTRAMUSCULAR | Status: DC | PRN

## 2014-05-12 MED ORDER — IOHEXOL 350 MG/ML SOLN
INTRAVENOUS | Status: DC | PRN
  Administered 2014-05-12: 125 mL via INTRAVENOUS

## 2014-05-12 MED ORDER — MIDAZOLAM HCL 2 MG/2ML IJ SOLN
INTRAMUSCULAR | Status: DC | PRN
  Administered 2014-05-12: 1 mg via INTRAVENOUS

## 2014-05-12 MED ORDER — SODIUM CHLORIDE 0.9 % WEIGHT BASED INFUSION: 1.0000 mL/kg/h | INTRAVENOUS | Status: DC

## 2014-05-12 MED ORDER — SODIUM CHLORIDE 0.9 % IV SOLN: INTRAVENOUS | Status: AC

## 2014-05-12 MED ORDER — HYDRALAZINE HCL 20 MG/ML IJ SOLN
INTRAMUSCULAR | Status: DC | PRN
  Administered 2014-05-12: 10 mg via INTRAVENOUS

## 2014-05-12 SURGICAL SUPPLY — 14 items
CATH INFINITI 5 FR JL3.5 (CATHETERS) IMPLANT
CATH INFINITI 5FR ANG PIGTAIL (CATHETERS) IMPLANT
CATH INFINITI 5FR MULTPACK ANG (CATHETERS) ×2 IMPLANT
CATH INFINITI JR4 5F (CATHETERS) IMPLANT
DEVICE RAD COMP TR BAND LRG (VASCULAR PRODUCTS) IMPLANT
GLIDESHEATH SLEND SS 6F .021 (SHEATH) IMPLANT
KIT HEART LEFT (KITS) ×2 IMPLANT
PACK CARDIAC CATHETERIZATION (CUSTOM PROCEDURE TRAY) ×2 IMPLANT
SHEATH PINNACLE 5F 10CM (SHEATH) ×2 IMPLANT
SYR MEDRAD MARK V 150ML (SYRINGE) ×2 IMPLANT
TRANSDUCER W/STOPCOCK (MISCELLANEOUS) ×2 IMPLANT
TUBING CIL FLEX 10 FLL-RA (TUBING) IMPLANT
WIRE EMERALD 3MM-J .035X150CM (WIRE) ×2 IMPLANT
WIRE SAFE-T 1.5MM-J .035X260CM (WIRE) IMPLANT

## 2014-05-12 NOTE — Progress Notes (Signed)
Site area: RFA Site Prior to Removal:  Level 1 raised area above site hard to palpation Pressure Applied For:6725min Manual:   yes Patient Status During Pull: stable Post Pull Site:  Level 0-soft, non raised Post Pull Instructions Given:  yes Post Pull Pulses Present: palpable Dressing Applied:  clesr Bedrest begins @ 1330 Comments:

## 2014-05-12 NOTE — Discharge Instructions (Signed)

## 2014-05-12 NOTE — H&P (View-Only) (Signed)
Patient ID: Alexis Dyer, female   DOB: 1923/12/01, 81 y.o.   MRN: [id number]      SUBJECTIVE: The patient returns for routine follow-up for coronary artery disease with a history of CABG as well as hypertension and hyperlipidemia. Nuclear stress test on 05/21/13 demonstrated a mild degree of anteroapical ischemia with overlying anterior wall soft tissue attenuation artifact also noted. LVEF 62%. At that time I had mentioned the possibility of coronary angiography but this was not pursued.  She has been feeling very short of breath. She saw her primary care physician yesterday obtain labs which demonstrated hemoglobin 11.1, BUN 30, creatinine 0.86, TSH 1.45. She wonders if she is having reactions to pravastatin. Her blood pressure is 150/74 today. [facility] and systolic was 135 mmHg.  ECG performed in the office today demonstrates sinus rhythm with first-degree AV block, PR interval 234 ms, with PACs and an underlying right bundle branch block, heart rate 73 bpm.   Review of Systems: As per "subjective", otherwise negative.  Allergies  Allergen Reactions  . Crestor [Rosuvastatin] Other (See Comments)    Leg Cramps    Current Outpatient Prescriptions  Medication Sig Dispense Refill  . ALPRAZolam (XANAX) 0.25 MG tablet Take 0.25 mg by mouth every 8 (eight) hours as needed for anxiety.    Marland Kitchen. amLODipine (NORVASC) 2.5 MG tablet Take 2.5 mg by mouth daily.     Marland Kitchen. aspirin 81 MG tablet Take 81 mg by mouth daily.    . fish oil-omega-3 fatty acids 1000 MG capsule Take 1 g by mouth daily. Includes magnesium and zinc and calcium    . furosemide (LASIX) 20 MG tablet Take 1 tablet (20 mg total) by mouth daily. 30 tablet 8  . isosorbide mononitrate (IMDUR) 60 MG 24 hr tablet Take 60 mg by mouth 2 (two) times daily. 1 AM 1/2 tab PM    . levothyroxine (SYNTHROID, LEVOTHROID) 100 MCG tablet Take 100 mcg by mouth daily before breakfast.    . metoprolol succinate (TOPROL-XL) 50  MG 24 hr tablet Take 1 tablet (50 mg total) by mouth daily. Take with or immediately following a meal. 30 tablet 6  . nitroGLYCERIN (NITROSTAT) 0.4 MG SL tablet Please 1 tablet under the tongue every 5 minutes for up to 3 doses as needed for chest pain. 25 tablet 4  . pravastatin (PRAVACHOL) 80 MG tablet Take 1 tablet (80 mg total) by mouth daily. 30 tablet 1  . valsartan-hydrochlorothiazide (DIOVAN-HCT) 320-25 MG per tablet Take 1 tablet by mouth daily. 30 tablet 9   No current facility-administered medications for this visit.    Past Medical History  Diagnosis Date  . SOB (shortness of breath)   . Hypertension 06/2011    2D Echo EF>55% mild to moderate tricuspid regurgitation and mild to moderate pulmonary hypertension.  . Ischemia 10/2011    stress test EF 67% with no ischemia  . Dyslipidemia   . Hypothyroidism   . CAD (coronary artery disease)     bypass in 1996 2D Echo preformed EF >55%  . Edema 06/22/2011    lower arterial for claudication  . Dyspnea 10/11/2011    stress test EF 67% normal stress test.  . Back pain     Past Surgical History  Procedure Laterality Date  . Coronary artery bypass graft  1996    LIMA to the obtuse marginal, saphenous vein graft to the LAD, saphenous vein graft to the obtuse marginal-1, saphenous vien graft to the  posterior diagonal artery performed by Dr Alean RinneMichael High  . Abdominal hysterectomy    . Cardiac catheterization  11/16/2006  . Cardiac catheterization  04/04/2001    History   Social History  . Marital Status: Widowed    Spouse Name: N/A  . Number of Children: N/A  . Years of Education: N/A   Occupational History  . Not on file.   Social History Main Topics  . Smoking status: Never Smoker   . Smokeless tobacco: Never Used  . Alcohol Use: No  . Drug Use: No  . Sexual Activity: Not on file   Other Topics Concern  . Not on file   Social History Narrative     Filed Vitals:   05/08/14 1342  BP: 150/74  Pulse: 48    Height: 5\' 4"  (1.626 m)  Weight: 185 lb (83.915 kg)  SpO2: 98%   HR 73 bpm by ECG with PAC's.  PHYSICAL EXAM General: NAD HEENT: Normal. Neck: No JVD, no thyromegaly. Lungs: Faint expiratory wheezes with normal respiratory effort. CV: Bradycardic, irregular hythm, normal S1/S2, no S3/S4, no murmur. No pretibial or periankle edema.    Abdomen: Soft, nontender, no distention.  Neurologic: Alert and oriented x 3.  Psych: Normal affect. Skin: Normal. Musculoskeletal: No gross deformities. Extremities: No clubbing or cyanosis.   ECG: Most recent ECG reviewed.      ASSESSMENT AND PLAN: 1. Shortness of breath in context of CAD and CABG: Given her shortness of breath and the abnormality seen on nuclear MPI study in 05/2013, I am concerned about ischemic heart disease being the etiology. Will arrange for coronary angiography next week. Continue aspirin, metoprolol, and Imdur 60 mg. Given her preference, I will d/c pravastatin.  2. Essential HTN: Mildly elevated on present therapy. No changes today. Will monitor given advanced age.  3. Hyperlipidemia: LDL 128 in 09/2013. Will stop pravastatin per patient preference.  Dispo: f/u after cath.  Time spent: 40 minutes, of which greater than 50% was spent reviewing symptoms, relevant blood tests and studies, and discussing management plan with the patient.   Prentice DockerSuresh Koneswaran, M.D., F.A.C.C.

## 2014-05-12 NOTE — Interval H&P Note (Signed)
History and Physical Interval Note:  05/12/2014 12:04 PM  Alexis Dyer  has presented today for cardiac cath with the diagnosis of unstable angina, CAD, Hx of abnormal stress test  The various methods of treatment have been discussed with the patient and family. After consideration of risks, benefits and other options for treatment, the patient has consented to  Procedure(s): Left Heart Cath and Cors/Grafts Angiography (N/A) as a surgical intervention .  The patient's history has been reviewed, patient examined, no change in status, stable for surgery.  I have reviewed the patient's chart and labs.  Questions were answered to the patient's satisfaction.    Cath Lab Visit (complete for each Cath Lab visit)  Clinical Evaluation Leading to the Procedure:   ACS: No.  Non-ACS:    Anginal Classification: CCS III  Anti-ischemic medical therapy: Maximal Therapy (2 or more classes of medications)  Non-Invasive Test Results: No non-invasive testing performed  Prior CABG: Previous CABG         MCALHANY,CHRISTOPHER

## 2014-05-13 LAB — POCT I-STAT 3, ART BLOOD GAS (G3+)
Acid-base deficit: 3 mmol/L — ABNORMAL HIGH (ref 0.0–2.0)
Bicarbonate: 23.8 mEq/L (ref 20.0–24.0)
O2 SAT: 99 %
PCO2 ART: 47.3 mmHg — AB (ref 35.0–45.0)
PH ART: 7.309 — AB (ref 7.350–7.450)
TCO2: 25 mmol/L (ref 0–100)
pO2, Arterial: 161 mmHg — ABNORMAL HIGH (ref 80.0–100.0)

## 2014-05-13 LAB — POCT I-STAT, CHEM 8
BUN: 24 mg/dL — ABNORMAL HIGH (ref 6–20)
Calcium, Ion: 1.32 mmol/L — ABNORMAL HIGH (ref 1.13–1.30)
Chloride: 104 mmol/L (ref 101–111)
Creatinine, Ser: 0.7 mg/dL (ref 0.44–1.00)
GLUCOSE: 120 mg/dL — AB (ref 70–99)
HCT: 37 % (ref 36.0–46.0)
HEMOGLOBIN: 12.6 g/dL (ref 12.0–15.0)
Potassium: 4.4 mmol/L (ref 3.5–5.1)
SODIUM: 139 mmol/L (ref 135–145)
TCO2: 21 mmol/L (ref 0–100)

## 2014-05-13 NOTE — Addendum Note (Signed)
Addended by: Burman NievesASHWORTH, Shantale Holtmeyer T on: 05/13/2014 08:34 AM   Modules accepted: Orders

## 2014-05-23 ENCOUNTER — Ambulatory Visit (INDEPENDENT_AMBULATORY_CARE_PROVIDER_SITE_OTHER): Payer: Medicare Other | Admitting: Cardiovascular Disease

## 2014-05-23 ENCOUNTER — Encounter: Payer: Self-pay | Admitting: Cardiovascular Disease

## 2014-05-23 VITALS — BP 150/72 | HR 74 | Ht 65.0 in | Wt 185.0 lb

## 2014-05-23 DIAGNOSIS — I10 Essential (primary) hypertension: Secondary | ICD-10-CM

## 2014-05-23 DIAGNOSIS — R0609 Other forms of dyspnea: Secondary | ICD-10-CM | POA: Diagnosis not present

## 2014-05-23 DIAGNOSIS — E039 Hypothyroidism, unspecified: Secondary | ICD-10-CM

## 2014-05-23 DIAGNOSIS — E785 Hyperlipidemia, unspecified: Secondary | ICD-10-CM | POA: Diagnosis not present

## 2014-05-23 DIAGNOSIS — I25709 Atherosclerosis of coronary artery bypass graft(s), unspecified, with unspecified angina pectoris: Secondary | ICD-10-CM

## 2014-05-23 MED ORDER — AMLODIPINE BESYLATE 5 MG PO TABS: 5.0000 mg | ORAL_TABLET | Freq: Every day | ORAL | Status: DC

## 2014-05-23 NOTE — Progress Notes (Signed)
Patient ID: Alexis Dyer, female   DOB: [birth year], 81 y.o.   MRN: [id number]      SUBJECTIVE: The patient returns after undergoing coronary angiography which demonstrated 4 out of 4 patent bypass grafts. She had severe hypertension of 179/72, and aggressive BP control was emphasized.  She is here with her daughter who is also my patient.  She is less short of breath than she was at her last visit with me, but does say that she becomes progressively more short of breath with exertion. Her sister fractured her arm and and has gone to live with her daughter in [place] for the summer, and Mrs. Vivia EwingWheatley typically spends a lot of time with her here in [place].   Review of Systems: As per "subjective", otherwise negative.  Allergies  Allergen Reactions  . Crestor [Rosuvastatin] Other (See Comments)    Leg Cramps  . Penicillins Other (See Comments)    Childhood allergy    Current Outpatient Prescriptions  Medication Sig Dispense Refill  . acetaminophen (TYLENOL) 500 MG tablet Take 500 mg by mouth every 6 (six) hours as needed (pain).    Marland Kitchen. ALPRAZolam (XANAX) 0.25 MG tablet Take 0.25 mg by mouth every 8 (eight) hours as needed for anxiety.    Marland Kitchen. amLODipine (NORVASC) 2.5 MG tablet Take 2.5 mg by mouth daily.     Marland Kitchen. aspirin 81 MG tablet Take 81 mg by mouth daily.    . fish oil-omega-3 fatty acids 1000 MG capsule Take 1 g by mouth daily. Includes magnesium and zinc and calcium    . furosemide (LASIX) 20 MG tablet Take 1 tablet (20 mg total) by mouth daily. 30 tablet 8  . isosorbide mononitrate (IMDUR) 60 MG 24 hr tablet Take 60 mg by mouth 2 (two) times daily. 1 AM 1/2 tab PM    . levothyroxine (SYNTHROID, LEVOTHROID) 100 MCG tablet Take 100 mcg by mouth daily before breakfast.    . metoprolol succinate (TOPROL-XL) 50 MG 24 hr tablet Take 1 tablet (50 mg total) by mouth daily. Take with or immediately following a meal. 30 tablet 6  . nitroGLYCERIN (NITROSTAT) 0.4 MG SL tablet Please 1  tablet under the tongue every 5 minutes for up to 3 doses as needed for chest pain. 25 tablet 4  . valsartan-hydrochlorothiazide (DIOVAN-HCT) 320-25 MG per tablet Take 1 tablet by mouth daily. 30 tablet 9   No current facility-administered medications for this visit.    Past Medical History  Diagnosis Date  . SOB (shortness of breath)   . Hypertension 06/2011    2D Echo EF>55% mild to moderate tricuspid regurgitation and mild to moderate pulmonary hypertension.  . Ischemia 10/2011    stress test EF 67% with no ischemia  . Dyslipidemia   . Hypothyroidism   . CAD (coronary artery disease)     bypass in 1996 2D Echo preformed EF >55%  . Edema 06/22/2011    lower arterial for claudication  . Dyspnea 10/11/2011    stress test EF 67% normal stress test.  . Back pain     Past Surgical History  Procedure Laterality Date  . Coronary artery bypass graft  1996    LIMA to the obtuse marginal, saphenous vein graft to the LAD, saphenous vein graft to the obtuse marginal-1, saphenous vien graft to the posterior diagonal artery performed by Dr Alean RinneMichael High  . Abdominal hysterectomy    . Cardiac catheterization  11/16/2006  . Cardiac catheterization  04/04/2001  . Cardiac catheterization  N/A 05/12/2014    Procedure: Left Heart Cath and Cors/Grafts Angiography;  Surgeon: Kathleene Hazelhristopher D McAlhany, MD;  [facility];  Service: Cardiovascular;  Laterality: N/A;    History   Social History  . Marital Status: Widowed    Spouse Name: N/A  . Number of Children: N/A  . Years of Education: N/A   Occupational History  . Not on file.   Social History Main Topics  . Smoking status: Never Smoker   . Smokeless tobacco: Never Used  . Alcohol Use: No  . Drug Use: No  . Sexual Activity: Not on file   Other Topics Concern  . Not on file   Social History Narrative     Filed Vitals:   05/23/14 1334  BP: 150/72  Pulse: 74  Height: 5\' 5"  (1.651 m)  Weight: 185 lb (83.915 kg)    SpO2: 98%    PHYSICAL EXAM General: NAD HEENT: Normal. Neck: No JVD, no thyromegaly. Lungs: Clear to auscultation bilaterally with normal respiratory effort. CV: Nondisplaced PMI.  Regular rate and rhythm, normal S1/S2, no S3/S4, no murmur. No pretibial or periankle edema.  No carotid bruit.  Normal pedal pulses.  Abdomen: Soft, nontender, no hepatosplenomegaly, no distention.  Neurologic: Alert and oriented x 3.  Psych: Normal affect. Skin: Normal. Musculoskeletal: Normal range of motion, no gross deformities. Extremities: No clubbing or cyanosis.   ECG: Most recent ECG reviewed.      ASSESSMENT AND PLAN: 1. Shortness of breath in context of CAD and CABG:  May be related to elevated BP with exertion. 4/4 patent bypass grafts. Will increase amlodipine to 5 mg daily. Continue aspirin, metoprolol, and Imdur 60 mg q am and 30 mg q pm. I previously d/c pravastatin earlier this month per her preference.  2. Essential HTN: Elevated on present therapy and markedly elevated at time of cath. Increase amlodipine to 5 mg daily.  3. Hyperlipidemia: LDL 128 in 09/2013. I previously d/c pravastatin per patient preference. Will check to see if lipids checked by PCP this year. If not, will do so.  4. Hypothyroidism: Will check to see if TSH checked by PCP this year. If not, will do so.  Dispo: f/u 6 months.   Prentice DockerSuresh Koneswaran, M.D., F.A.C.C.

## 2014-05-23 NOTE — Patient Instructions (Signed)
Your physician recommends that you schedule a follow-up appointment in: 6 months with Dr. Purvis SheffieldKoneswaran  Your physician has recommended you make the following change in your medication:   INCREASE AMLODIPINE 5 MG DAILY  CONTINUE ALL OTHER MEDICATIONS AS DIRECTED  Thank you for choosing [facility]!!

## 2014-05-27 ENCOUNTER — Encounter: Payer: Self-pay | Admitting: *Deleted

## 2014-07-03 ENCOUNTER — Telehealth: Payer: Self-pay | Admitting: *Deleted

## 2014-07-03 NOTE — Telephone Encounter (Signed)
Per Dr. Purvis SheffieldKoneswaran - reviewed recent PFT done by PMD.  Test results revealed breathing tests were only mildly reduced.  Dr. Margo Commonapper feels her SOB is more due to her heart.    Per Dr. Purvis SheffieldKoneswaran - have patient come in for nurse visit (BP check) & question if having any swelling in legs.  If yes, then increase Lasix to 40mg  daily with BMET 3 days after.  MD will decide any further treatment after this.

## 2014-07-04 NOTE — Telephone Encounter (Signed)
Patient states SOB comes & goes.  More noticeably with activity.    BP running 6/30 - 128/99  HR  49 6/29 - 114/45  56 6/28 - 119/46  59  No swelling in feet & legs - only if up on feet a lot.  No chest pain or dizziness.  No weight gain.    Medications reviewed with patient and are taking all as prescribed at last OV on 05/23/2014.

## 2014-07-04 NOTE — Telephone Encounter (Signed)
No blockages of bypass grafts to suggest ischemic etiology by recent cath. BP now well controlled on amlodipine. Stop Imdur and start Ranexa 500 mg bid to see if this helps to improve symptoms. Also reduce Toprol-XL to 25 mg daily.

## 2014-07-09 ENCOUNTER — Encounter: Payer: Self-pay | Admitting: *Deleted

## 2014-07-09 MED ORDER — RANOLAZINE ER 500 MG PO TB12: 500.0000 mg | ORAL_TABLET | Freq: Two times a day (BID) | ORAL | Status: DC

## 2014-07-09 MED ORDER — METOPROLOL SUCCINATE ER 25 MG PO TB24: 25.0000 mg | ORAL_TABLET | Freq: Every day | ORAL | Status: DC

## 2014-07-09 NOTE — Telephone Encounter (Signed)
Patient notified.  She agrees to trying the Ranexa & decreasing her Toprol XL.  Will give samples of the Ranexa to get her started & [facility].  I have asked her to call back with update in 2-3 weeks.  Patient verbalized understanding.  Written instructions provided at patient's request.  She will have her daughter come by office tomorrow to pick up.

## 2014-07-09 NOTE — Addendum Note (Signed)
Addended by: Lesle ChrisHILL, Jatziry Wechter G on: 07/09/2014 05:43 PM   Modules accepted: Orders

## 2014-11-20 ENCOUNTER — Telehealth: Payer: Self-pay | Admitting: *Deleted

## 2014-11-20 NOTE — Telephone Encounter (Signed)
Patient called office with c/o SOB.  Been bothering x 1 week & is worse with activity.  States this is different for her.  No other c/o chest pain, dizziness, cough, fever, weight gain, or edema.  Patient does sound notably hoarse on the phone, but she states she does get this way occasionally.  Offered appointment in [place] this afternoon with Harriet PhoK. Lawrence, but she does not have transportation to get there.  Informed patient that message will be sent to Dr. Purvis SheffieldKoneswaran for further advice & to see if office visit is needed.  Advised patient to call 911 / ED if symptoms worsen in the meantime.  Patient verbalized understanding.

## 2014-11-20 NOTE — Telephone Encounter (Signed)
Patient also did report BP 112/56 and HR 85.

## 2014-11-20 NOTE — Telephone Encounter (Signed)
Returned call to patient to offer appointment tomorrow.  Patient stated that she is feeling better now & thinks she was having some congestion.  She will hold off on office visit for now & see her PMD soon.  If anything changes, she will call us back.  Patient appreciative of the call back.

## 2014-12-05 ENCOUNTER — Other Ambulatory Visit: Payer: Self-pay | Admitting: *Deleted

## 2014-12-05 ENCOUNTER — Ambulatory Visit (INDEPENDENT_AMBULATORY_CARE_PROVIDER_SITE_OTHER): Payer: Medicare Other | Admitting: Cardiovascular Disease

## 2014-12-05 ENCOUNTER — Encounter: Payer: Self-pay | Admitting: Cardiovascular Disease

## 2014-12-05 VITALS — BP 126/57 | HR 64 | Ht 65.0 in | Wt 193.4 lb

## 2014-12-05 DIAGNOSIS — I739 Peripheral vascular disease, unspecified: Secondary | ICD-10-CM

## 2014-12-05 DIAGNOSIS — I4891 Unspecified atrial fibrillation: Secondary | ICD-10-CM

## 2014-12-05 DIAGNOSIS — I25709 Atherosclerosis of coronary artery bypass graft(s), unspecified, with unspecified angina pectoris: Secondary | ICD-10-CM | POA: Diagnosis not present

## 2014-12-05 DIAGNOSIS — Z7901 Long term (current) use of anticoagulants: Secondary | ICD-10-CM

## 2014-12-05 DIAGNOSIS — I493 Ventricular premature depolarization: Secondary | ICD-10-CM | POA: Diagnosis not present

## 2014-12-05 DIAGNOSIS — Z5181 Encounter for therapeutic drug level monitoring: Secondary | ICD-10-CM | POA: Diagnosis not present

## 2014-12-05 DIAGNOSIS — E785 Hyperlipidemia, unspecified: Secondary | ICD-10-CM

## 2014-12-05 DIAGNOSIS — I10 Essential (primary) hypertension: Secondary | ICD-10-CM

## 2014-12-05 DIAGNOSIS — R0609 Other forms of dyspnea: Secondary | ICD-10-CM | POA: Diagnosis not present

## 2014-12-05 DIAGNOSIS — E039 Hypothyroidism, unspecified: Secondary | ICD-10-CM

## 2014-12-05 MED ORDER — METOPROLOL SUCCINATE ER 25 MG PO TB24: 25.0000 mg | ORAL_TABLET | Freq: Two times a day (BID) | ORAL | Status: DC

## 2014-12-05 MED ORDER — APIXABAN 5 MG PO TABS: 5.0000 mg | ORAL_TABLET | Freq: Two times a day (BID) | ORAL | Status: DC

## 2014-12-05 NOTE — Progress Notes (Signed)
Patient ID: Alexis Dyer, female   DOB: 12-02-1923, 81 y.o.   MRN: [id number]      SUBJECTIVE: The patient presents for routine cardiovascular follow-up. She has a history of coronary disease and CABG. She underwent coronary angiography earlier this year which demonstrated 4 out of 4 patent bypass grafts. She has intermittent shortness of breath. Earlier this year, I discontinued Imdur and started Ranexa. PCP restarted Imdur. She prefers not to be on statin therapy.  Labs on 05/16/14 showed TSH 1.45, total cholesterol 269, triglycerides 257, HDL 39, LDL 169.  She denies chest pain but continues to complain of exertional dyspnea. She sometimes has a dry cough. She has had some left leg swelling. Her PCP also increased her Xanax dose as he wondered whether her shortness of breath was due to anxiety to some degree according to the patient. The patient does admit to being very anxious.   Left ventriculography in May 2016 showed EF greater than 65% with no wall motion abnormalities.  Upon physical examination, I noted her heart rate was normal but her rhythm was irregular. I ordered an ECG which demonstrates atrial fibrillation with a right bundle branch block and old inferior infarct, heart rate 82 bpm.  Review of Systems: As per "subjective", otherwise negative.  Allergies  Allergen Reactions  . Crestor [Rosuvastatin] Other (See Comments)    Leg Cramps  . Penicillins Other (See Comments)    Childhood allergy    Current Outpatient Prescriptions  Medication Sig Dispense Refill  . acetaminophen (TYLENOL) 500 MG tablet Take 500 mg by mouth every 6 (six) hours as needed (pain).    Marland Kitchen ALPRAZolam (XANAX) 0.25 MG tablet Take 0.25 mg by mouth every 8 (eight) hours as needed for anxiety.    Marland Kitchen amLODipine (NORVASC) 5 MG tablet Take 1 tablet (5 mg total) by mouth daily. 180 tablet 3  . aspirin 81 MG tablet Take 81 mg by mouth daily.    . colchicine 0.6 MG tablet Take 0.6 mg by mouth as needed.    .  fish oil-omega-3 fatty acids 1000 MG capsule Take 1 g by mouth daily. Includes magnesium and zinc and calcium    . furosemide (LASIX) 20 MG tablet Take 1 tablet (20 mg total) by mouth daily. 30 tablet 8  . isosorbide mononitrate (IMDUR) 30 MG 24 hr tablet Take 1 tablet by mouth daily.    Marland Kitchen levothyroxine (SYNTHROID, LEVOTHROID) 100 MCG tablet Take 100 mcg by mouth daily before breakfast.    . metoprolol succinate (TOPROL-XL) 25 MG 24 hr tablet Take 1 tablet (25 mg total) by mouth daily. 30 tablet 6  . nitroGLYCERIN (NITROSTAT) 0.4 MG SL tablet Please 1 tablet under the tongue every 5 minutes for up to 3 doses as needed for chest pain. 25 tablet 4  . ranolazine (RANEXA) 500 MG 12 hr tablet Take 1 tablet (500 mg total) by mouth 2 (two) times daily. 42 tablet 0  . valsartan-hydrochlorothiazide (DIOVAN-HCT) 320-25 MG per tablet Take 1 tablet by mouth daily. 30 tablet 9   No current facility-administered medications for this visit.    Past Medical History  Diagnosis Date  . SOB (shortness of breath)   . Hypertension 06/2011    2D Echo EF>55% mild to moderate tricuspid regurgitation and mild to moderate pulmonary hypertension.  . Ischemia 10/2011    stress test EF 67% with no ischemia  . Dyslipidemia   . Hypothyroidism   . CAD (coronary artery disease)     bypass  in 1996 2D Echo preformed EF >55%  . Edema 06/22/2011    lower arterial for claudication  . Dyspnea 10/11/2011    stress test EF 67% normal stress test.  . Back pain     Past Surgical History  Procedure Laterality Date  . Coronary artery bypass graft  1996    LIMA to the obtuse marginal, saphenous vein graft to the LAD, saphenous vein graft to the obtuse marginal-1, saphenous vien graft to the posterior diagonal artery performed by Dr Alean RinneMichael High  . Abdominal hysterectomy    . Cardiac catheterization  11/16/2006  . Cardiac catheterization  04/04/2001  . Cardiac catheterization N/A 05/12/2014    Procedure: Left Heart Cath and  Cors/Grafts Angiography;  Surgeon: Kathleene Hazelhristopher D McAlhany, MD;  [facility];  Service: Cardiovascular;  Laterality: N/A;    Social History   Social History  . Marital Status: Widowed    Spouse Name: N/A  . Number of Children: N/A  . Years of Education: N/A   Occupational History  . Not on file.   Social History Main Topics  . Smoking status: Never Smoker   . Smokeless tobacco: Never Used  . Alcohol Use: No  . Drug Use: No  . Sexual Activity: Not on file   Other Topics Concern  . Not on file   Social History Narrative     Filed Vitals:   12/05/14 1110  BP: 126/57  Pulse: 64  Height: 5\' 5"  (1.651 m)  Weight: 193 lb 6.4 oz (87.726 kg)  SpO2: 98%    PHYSICAL EXAM General: NAD HEENT: Normal. Neck: No JVD, no thyromegaly. Lungs: Clear to auscultation bilaterally with normal respiratory effort. CV: Regular rate and irregular rhythm, normal S1/S2, no S3, no murmur. Left leg with nonpitting edema.    Abdomen: Soft, nontender, no distention.  Neurologic: Alert and oriented x 3.  Psych: Normal affect. Skin: Normal. Musculoskeletal: No gross deformities. Extremities: No clubbing or cyanosis.   ECG: Most recent ECG reviewed.      ASSESSMENT AND PLAN: 1. CAD and CABG: Symptomatically stable. Will stop aspirin (see #5) and continue metoprolol succinate (increase to 25 mg bid) and Ranexa. PCP restarted Imdur. Does not want statin therapy.  2. Essential HTN: Controlled on amlodipine 5 mg and Diovan-HCT 320-25 mg daily. No changes.  3. Hyperlipidemia: Lipids from 05/2014 noted above. Prefers to avoid statin therapy.  4. Hypothyroidism: TSH normal in 05/2014.  5. Shortness of breath in context of new-onset atrial fibrillation: It is possible that elevated heart rates with exertion are responsible for her symptoms. I will stop aspirin and start Eliquis 5 mg twice daily for thromboembolic protection given elevated CHA2DS2VASC score of 5. Creatinine normal  earlier this year. Will increase Toprol-XL to 25 mg bid.  Dispo: f/u 6 weeks.  Time spent: 40 minutes, of which greater than 50% was spent reviewing symptoms, relevant blood tests and studies, and discussing management plan with the patient.   Prentice DockerSuresh Wilhelm Ganaway, M.D., F.A.C.C.

## 2014-12-05 NOTE — Patient Instructions (Signed)
   Stop Aspirin.  Begin Eliquis 5mg  twice a day - samples, free 30-day trial offer, & printed scripts given today.  Increase Toprol XL to 25mg  twice a day - printed script given. Continue all other medications.   Follow up with anticoagulation nurse in 1 month Misty Stanley(Lisa). Follow up in  6 weeks

## 2014-12-08 ENCOUNTER — Telehealth: Payer: Self-pay | Admitting: *Deleted

## 2014-12-08 NOTE — Telephone Encounter (Signed)
Pt daughter says pt is still experiencing SOB over the weekend, denies CP/dizzniess, says pt legs were tight and swollen over the weekend, but has gone down some today. somewhat disorientated last night according to daughter. Will forward to Dr. Purvis SheffieldKoneswaran

## 2014-12-08 NOTE — Telephone Encounter (Signed)
Metoprolol dose was just increased so will need time to fully take optimal effect. Can increase Lasix to 40 mg daily for next 3 days and then reduce to 20 mg daily.

## 2014-12-08 NOTE — Telephone Encounter (Signed)
Pt daughter aware and will increase lasix 40 mg X 3 days. Daughter not convinced that pt had increased dose of Toprol this past weekend but will monitor pt so that she will take as increased on 12/2 OV.

## 2014-12-09 ENCOUNTER — Telehealth: Payer: Self-pay | Admitting: *Deleted

## 2014-12-09 NOTE — Telephone Encounter (Signed)
[employer] - prior authorization approved for Eliquis 5mg  through 12/05/2015.

## 2014-12-25 ENCOUNTER — Encounter: Payer: Self-pay | Admitting: Cardiovascular Disease

## 2015-01-08 ENCOUNTER — Ambulatory Visit (INDEPENDENT_AMBULATORY_CARE_PROVIDER_SITE_OTHER): Payer: Medicare Other | Admitting: *Deleted

## 2015-01-08 DIAGNOSIS — I480 Paroxysmal atrial fibrillation: Secondary | ICD-10-CM

## 2015-01-08 DIAGNOSIS — Z5181 Encounter for therapeutic drug level monitoring: Secondary | ICD-10-CM | POA: Diagnosis not present

## 2015-01-08 NOTE — Progress Notes (Signed)
Pt was started on Eliquis 5mg  bid for atrial fib on 12/05/14 by Dr Purvis SheffieldKoneswaran.    Reviewed patients medication list.  Pt is not currently on any combined P-gp and strong CYP3A4 inhibitors/inducers (ketoconazole, traconazole, ritonavir, carbamazepine, phenytoin, rifampin, St. John's wort).  Reviewed labs from 01/08/15 @ [facility].  SCr 0.96, Weight 188.8, CrCl 53.71  Dose is appropriate based on 2 out of 3 criteria (age,wt,SCr).   Hgb and HCT 10.6/33.2  (Stable)  A full discussion of the nature of anticoagulants has been carried out.  A benefit/risk analysis has been presented to the patient, so that they understand the justification for choosing anticoagulation with Eliquis at this time.  The need for compliance is stressed.  Pt is aware to take the medication twice daily.  Side effects of potential bleeding are discussed, including unusual colored urine or stools, coughing up blood or coffee ground emesis, nose bleeds or serious fall or head trauma.  Discussed signs and symptoms of stroke. The patient should avoid any OTC items containing aspirin or ibuprofen.  Avoid alcohol consumption.   Call if any signs of abnormal bleeding.  Discussed financial obligations and resolved any difficulty in obtaining medication.  Next lab test test in 6 months.   Call pt's son [name](Mike [phone]) or daughter Dia Sitter(Lera [phone]) with lab results and schedule Follow up appt.  Pt had Low Sodium on labs.  Pt's son was notified by Burman NievesStaci Ashworth CMA.  See telephone note.

## 2015-01-09 ENCOUNTER — Encounter: Payer: Self-pay | Admitting: *Deleted

## 2015-01-09 ENCOUNTER — Telehealth: Payer: Self-pay | Admitting: *Deleted

## 2015-01-09 DIAGNOSIS — I10 Essential (primary) hypertension: Secondary | ICD-10-CM

## 2015-01-09 NOTE — Telephone Encounter (Signed)
Alexis PocheJonathan F Branch, MD  Alexis Dyer Para, CMA Cc: Louanna RawLisa M Reid, RN           Keegan Bensch,   I was otified about low sodium of 127 for patient. Likely medication related, she is on both HCTZ (as part of her diovan HCT) and lasix daily. Please verify she has been taking both, I would change her lasix to just as needed and repeat BMET in 2 weeks. Please update me tomorrow.    [name]       Pt requested for us to call her son who manages medication and requested discharge summary from 12/10/14. Received [facility] records, pt was instructed to d/c Diovan/HCT and change lasix to as needed for swelling. Son made aware and verbalized understanding. Will repeat lab work in 2 weeks. Order for labs place and mailed to pt home with instructions. Will forward to Dr. Wyline MoodBranch as Lorain ChildesFYI.

## 2015-01-17 ENCOUNTER — Other Ambulatory Visit: Payer: Self-pay | Admitting: Cardiovascular Disease

## 2015-01-19 ENCOUNTER — Ambulatory Visit (INDEPENDENT_AMBULATORY_CARE_PROVIDER_SITE_OTHER): Payer: Medicare Other | Admitting: Cardiovascular Disease

## 2015-01-19 ENCOUNTER — Encounter: Payer: Self-pay | Admitting: *Deleted

## 2015-01-19 ENCOUNTER — Encounter: Payer: Self-pay | Admitting: Cardiovascular Disease

## 2015-01-19 VITALS — BP 130/70 | HR 74 | Ht 65.0 in | Wt 184.0 lb

## 2015-01-19 DIAGNOSIS — I5032 Chronic diastolic (congestive) heart failure: Secondary | ICD-10-CM

## 2015-01-19 DIAGNOSIS — E871 Hypo-osmolality and hyponatremia: Secondary | ICD-10-CM

## 2015-01-19 DIAGNOSIS — E785 Hyperlipidemia, unspecified: Secondary | ICD-10-CM | POA: Diagnosis not present

## 2015-01-19 DIAGNOSIS — Z9289 Personal history of other medical treatment: Secondary | ICD-10-CM

## 2015-01-19 DIAGNOSIS — I10 Essential (primary) hypertension: Secondary | ICD-10-CM | POA: Diagnosis not present

## 2015-01-19 DIAGNOSIS — Z87898 Personal history of other specified conditions: Secondary | ICD-10-CM

## 2015-01-19 DIAGNOSIS — I25709 Atherosclerosis of coronary artery bypass graft(s), unspecified, with unspecified angina pectoris: Secondary | ICD-10-CM

## 2015-01-19 DIAGNOSIS — I4891 Unspecified atrial fibrillation: Secondary | ICD-10-CM

## 2015-01-19 NOTE — Patient Instructions (Signed)
   Lab for BMET - do today.  Order given. Office will contact with results via phone or letter.   Continue all current medications. Follow up in  1 month  - [place]

## 2015-01-19 NOTE — Progress Notes (Signed)
Patient ID: Alexis Dyer, female   DOB: November 03, 1923, 82 y.o.   MRN: [id number]      SUBJECTIVE: The patient returns for follow-up of atrial fibrillation coronary artery disease. Previously had hyponatremia with sodium 127. Lasix was changed to as needed. She was also on hydrochlorothiazide.  Since I last saw her she reports being [facility]. I do not have any of these records. She is here with her son and daughter-in-law. She had reduced Lasix to 20 mg daily but then began retaining fluid and was hospitalized for heart failure. She is now taking Lasix 20 mg twice daily. She has not had a recent blood test for sodium. She denies chest pain. She has been having anxiety attacks at night. Denies leg swelling. Problems with constipation.   Review of Systems: As per "subjective", otherwise negative.  Allergies  Allergen Reactions  . Crestor [Rosuvastatin] Other (See Comments)    Leg Cramps  . Penicillins Other (See Comments)    Childhood allergy    Current Outpatient Prescriptions  Medication Sig Dispense Refill  . acetaminophen (TYLENOL) 500 MG tablet Take 500 mg by mouth every 6 (six) hours as needed (pain).    Marland Kitchen. ALPRAZolam (XANAX) 0.5 MG tablet Take 1 tablet by mouth daily.    Marland Kitchen. amLODipine (NORVASC) 5 MG tablet Take 1 tablet (5 mg total) by mouth daily. 180 tablet 3  . colchicine 0.6 MG tablet Take 0.6 mg by mouth as needed.    Marland Kitchen. ELIQUIS 5 MG TABS tablet TAKE 1 TABLET BY MOUTH TWICE DAILY 60 tablet 6  . furosemide (LASIX) 20 MG tablet Take 20-40 mg by mouth daily as needed.    . isosorbide mononitrate (IMDUR) 30 MG 24 hr tablet Take 1 tablet by mouth daily.    Marland Kitchen. levothyroxine (SYNTHROID, LEVOTHROID) 100 MCG tablet Take 100 mcg by mouth daily before breakfast.    . metoprolol succinate (TOPROL-XL) 25 MG 24 hr tablet Take 1 tablet (25 mg total) by mouth 2 (two) times daily. 60 tablet 6  . Multiple Vitamin (MULTIVITAMIN) tablet Take 1  tablet by mouth daily.    . nitroGLYCERIN (NITROSTAT) 0.4 MG SL tablet Please 1 tablet under the tongue every 5 minutes for up to 3 doses as needed for chest pain. 25 tablet 4  . PROAIR HFA 108 (90 Base) MCG/ACT inhaler Inhale 2 puffs into the lungs every 4 (four) hours as needed.    . ranolazine (RANEXA) 500 MG 12 hr tablet Take 1 tablet (500 mg total) by mouth 2 (two) times daily. 42 tablet 0   No current facility-administered medications for this visit.    Past Medical History  Diagnosis Date  . SOB (shortness of breath)   . Hypertension 06/2011    2D Echo EF>55% mild to moderate tricuspid regurgitation and mild to moderate pulmonary hypertension.  . Ischemia 10/2011    stress test EF 67% with no ischemia  . Dyslipidemia   . Hypothyroidism   . CAD (coronary artery disease)     bypass in 1996 2D Echo preformed EF >55%  . Edema 06/22/2011    lower arterial for claudication  . Dyspnea 10/11/2011    stress test EF 67% normal stress test.  . Back pain     Past Surgical History  Procedure Laterality Date  . Coronary artery bypass graft  1996    LIMA to the obtuse marginal, saphenous vein graft to the LAD, saphenous vein graft to the obtuse marginal-1,  saphenous vien graft to the posterior diagonal artery performed by Dr Alean Rinne  . Abdominal hysterectomy    . Cardiac catheterization  11/16/2006  . Cardiac catheterization  04/04/2001  . Cardiac catheterization N/A 05/12/2014    Procedure: Left Heart Cath and Cors/Grafts Angiography;  Surgeon: Kathleene Hazel, MD;  Location: [facility] INVASIVE CV LAB;  Service: Cardiovascular;  Laterality: N/A;    Social History   Social History  . Marital Status: Widowed    Spouse Name: N/A  . Number of Children: N/A  . Years of Education: N/A   Occupational History  . Not on file.   Social History Main Topics  . Smoking status: Never Smoker   . Smokeless tobacco: Never Used  . Alcohol Use: No  . Drug Use: No  . Sexual Activity: Not  on file   Other Topics Concern  . Not on file   Social History Narrative     Filed Vitals:   01/19/15 1405  BP: 130/70  Pulse: 74  Height: 5\' 5"  (1.651 m)  Weight: 184 lb (83.462 kg)    PHYSICAL EXAM General: NAD HEENT: Normal. Neck: No JVD, no thyromegaly. Lungs: Clear to auscultation bilaterally with normal respiratory effort. CV: Regular rate and irregular rhythm, normal S1/S2, no S3, no murmur. No lower extremity edema.  Abdomen: Soft, nontender, no distention.  Neurologic: Alert and oriented.  Psych: Normal affect. Skin: Normal. Musculoskeletal: No gross deformities. Extremities: No clubbing or cyanosis.  ECG: Most recent ECG reviewed.      ASSESSMENT AND PLAN: 1. CAD and CABG: Symptomatically stable. Continue metoprolol succinate and Ranexa. PCP restarted Imdur. Does not want statin therapy.  2. Essential HTN: Controlled. No changes.  3. Hyperlipidemia: Lipids from 05/2014 reviewed. Prefers to avoid statin therapy.  4. Hypothyroidism: TSH normal in 05/2014.  5. New-onset atrial fibrillation: Continue Eliquis 5 mg twice daily for thromboembolic protection given elevated CHA2DS2VASC score of 5. Continue Toprol-XL 25 mg bid.  6. Hyponatremia: Will check BMET today. Will obtain all hospital records.  7. Constipation: Has tried Metamucil and prune juice without effect. Certainly being exacerbated by Lasix. Have recommended suppositories.  8. Chronic diastolic heart failure: Appears euvolemic on Lasix 20 mg bid. Obtain BMET today to check Na level.  Dispo: f/u 1 month with NP in .    Prentice Docker, M.D., F.A.C.C.

## 2015-01-20 ENCOUNTER — Telehealth: Payer: Self-pay | Admitting: *Deleted

## 2015-01-20 NOTE — Telephone Encounter (Signed)
Notes Recorded by Lesle Chris, LPN on 09/01/5619 at 2:56 PM Patient notified. Copy to pmd.

## 2015-01-20 NOTE — Telephone Encounter (Signed)
-----   Message from Laqueta Linden, MD sent at 01/19/2015  4:27 PM EST ----- Ok.

## 2015-02-16 ENCOUNTER — Ambulatory Visit (INDEPENDENT_AMBULATORY_CARE_PROVIDER_SITE_OTHER): Payer: Medicare Other | Admitting: Adult Health

## 2015-02-16 ENCOUNTER — Encounter: Payer: Self-pay | Admitting: Adult Health

## 2015-02-16 VITALS — BP 106/62 | HR 77 | Ht 64.0 in | Wt 180.0 lb

## 2015-02-16 DIAGNOSIS — I5032 Chronic diastolic (congestive) heart failure: Secondary | ICD-10-CM

## 2015-02-16 DIAGNOSIS — R300 Dysuria: Secondary | ICD-10-CM

## 2015-02-16 DIAGNOSIS — I481 Persistent atrial fibrillation: Secondary | ICD-10-CM

## 2015-02-16 DIAGNOSIS — I4819 Other persistent atrial fibrillation: Secondary | ICD-10-CM

## 2015-02-16 NOTE — Progress Notes (Deleted)
Name: Alexis Dyer    DOB: [birth year]  Age: 82 y.o.  MR#: [id number]       PCP:  Deloria Lair, MD      Insurance: Payor: [place] / Plan: [employer] MEDICARE / Product Type: *No Product type* /   CC:    Chief Complaint  Patient presents with  . Atrial Fibrillation  . Coronary Artery Disease    VS Filed Vitals:   02/16/15 1316  BP: 106/62  Pulse: 77  Height: 5' 4"  (1.626 m)  Weight: 180 lb (81.647 kg)  SpO2: 92%    Weights Current Weight  02/16/15 180 lb (81.647 kg)  01/19/15 184 lb (83.462 kg)  12/05/14 193 lb 6.4 oz (87.726 kg)    Blood Pressure  BP Readings from Last 3 Encounters:  02/16/15 106/62  01/19/15 130/70  12/05/14 126/57     Admit date:  (Not on file) Last encounter with RMR:  Visit date not found   Allergy Crestor and Penicillins  Current Outpatient Prescriptions  Medication Sig Dispense Refill  . acetaminophen (TYLENOL) 500 MG tablet Take 500 mg by mouth every 6 (six) hours as needed (pain).    Marland Kitchen ALPRAZolam (XANAX) 0.5 MG tablet Take 1 tablet by mouth daily.    Marland Kitchen amLODipine (NORVASC) 5 MG tablet Take 1 tablet (5 mg total) by mouth daily. 180 tablet 3  . colchicine 0.6 MG tablet Take 0.6 mg by mouth as needed.    Marland Kitchen ELIQUIS 5 MG TABS tablet TAKE 1 TABLET BY MOUTH TWICE DAILY 60 tablet 6  . furosemide (LASIX) 20 MG tablet Take 20-40 mg by mouth daily as needed.    . isosorbide mononitrate (IMDUR) 30 MG 24 hr tablet Take 1 tablet by mouth daily.    Marland Kitchen levothyroxine (SYNTHROID, LEVOTHROID) 100 MCG tablet Take 100 mcg by mouth daily before breakfast.    . metoprolol succinate (TOPROL-XL) 25 MG 24 hr tablet Take 1 tablet (25 mg total) by mouth 2 (two) times daily. 60 tablet 6  . nitroGLYCERIN (NITROSTAT) 0.4 MG SL tablet Please 1 tablet under the tongue every 5 minutes for up to 3 doses as needed for chest pain. 25 tablet 4  . PROAIR HFA 108 (90 Base) MCG/ACT inhaler Inhale 2 puffs into the lungs every 4 (four) hours as needed.    . ranolazine  (RANEXA) 500 MG 12 hr tablet Take 1 tablet (500 mg total) by mouth 2 (two) times daily. 42 tablet 0   No current facility-administered medications for this visit.    Discontinued Meds:    Medications Discontinued During This Encounter  Medication Reason  . Multiple Vitamin (MULTIVITAMIN) tablet Error    Patient Active Problem List   Diagnosis Date Noted  . CAD (coronary artery disease) of artery bypass graft 10/05/2012  . HTN (hypertension) 10/05/2012  . Hyperlipidemia 10/05/2012  . PVC's (premature ventricular contractions) 10/05/2012    LABS    Component Value Date/Time   NA 139 05/12/2014 1241   NA 139 05/12/2014 1115   NA 135 02/09/2009 1200   K 4.4 05/12/2014 1241   K 4.6 05/12/2014 1115   K 4.1 02/09/2009 1200   CL 104 05/12/2014 1241   CL 106 05/12/2014 1115   CL 104 02/09/2009 1200   CO2 25 05/12/2014 1115   CO2 22 02/09/2009 1200   GLUCOSE 120* 05/12/2014 1241   GLUCOSE 117* 05/12/2014 1115   GLUCOSE 129* 02/09/2009 1200   BUN 24* 05/12/2014 1241   BUN 23* 05/12/2014  1115   BUN 24* 02/09/2009 1200   CREATININE 0.70 05/12/2014 1241   CREATININE 0.87 05/12/2014 1115   CREATININE 1.02 02/09/2009 1200   CALCIUM 9.3 05/12/2014 1115   CALCIUM 9.0 02/09/2009 1200   GFRNONAA 58* 05/12/2014 1115   GFRNONAA 52* 02/09/2009 1200   GFRAA >60 05/12/2014 1115   GFRAA  02/09/2009 1200    >60        The eGFR has been calculated using the MDRD equation. This calculation has not been validated in all clinical situations. eGFR's persistently <60 mL/min signify possible Chronic Kidney Disease.   CMP     Component Value Date/Time   NA 139 05/12/2014 1241   K 4.4 05/12/2014 1241   CL 104 05/12/2014 1241   CO2 25 05/12/2014 1115   GLUCOSE 120* 05/12/2014 1241   BUN 24* 05/12/2014 1241   CREATININE 0.70 05/12/2014 1241   CALCIUM 9.3 05/12/2014 1115   PROT 6.5 09/05/2013 1441   ALBUMIN 4.1 09/05/2013 1441   AST 16 09/05/2013 1441   ALT 13 09/05/2013 1441    ALKPHOS 41 09/05/2013 1441   BILITOT 0.6 09/05/2013 1441   GFRNONAA 58* 05/12/2014 1115   GFRAA >60 05/12/2014 1115       Component Value Date/Time   WBC 5.3 05/12/2014 1115   HGB 12.6 05/12/2014 1241   HGB 11.7* 05/12/2014 1115   HCT 37.0 05/12/2014 1241   HCT 36.9 05/12/2014 1115   MCV 92.3 05/12/2014 1115    Lipid Panel     Component Value Date/Time   CHOL 211* 09/05/2013 1441   TRIG 200* 09/05/2013 1441   HDL 43 09/05/2013 1441   CHOLHDL 4.9 09/05/2013 1441   VLDL 40 09/05/2013 1441   LDLCALC 128* 09/05/2013 1441    ABG    Component Value Date/Time   PHART 7.309* 05/12/2014 1235   PCO2ART 47.3* 05/12/2014 1235   PO2ART 161.0* 05/12/2014 1235   HCO3 23.8 05/12/2014 1235   TCO2 21 05/12/2014 1241   ACIDBASEDEF 3.0* 05/12/2014 1235   O2SAT 99.0 05/12/2014 1235     No results found for: TSH BNP (last 3 results) No results for input(s): BNP in the last 8760 hours.  ProBNP (last 3 results) No results for input(s): PROBNP in the last 8760 hours.  Cardiac Panel (last 3 results) No results for input(s): CKTOTAL, CKMB, TROPONINI, RELINDX in the last 72 hours.  Iron/TIBC/Ferritin/ %Sat No results found for: IRON, TIBC, FERRITIN, IRONPCTSAT   EKG Orders placed or performed in visit on 01/20/15  . EKG  . EKG     Prior Assessment and Plan Problem List as of 02/16/2015      Cardiovascular and Mediastinum   CAD (coronary artery disease) of artery bypass graft   HTN (hypertension)   PVC's (premature ventricular contractions)     Other   Hyperlipidemia       Imaging: No results found.

## 2015-02-16 NOTE — Patient Instructions (Addendum)
Your physician recommends that you schedule a follow-up appointment in:  [place] with Dr. Purvis Sheffield in 2 weeks.   Your physician has recommended you make the following change in your medication:   Increase Lasix to 40 mg for the next 2 Days   Your physician recommends that you have lab work done today.  Your physician recommends that you weigh, daily, at the same time every day, and in the same amount of clothing. Please record your daily weights on the handout provided and bring it to your next appointment.   If you need a refill on your cardiac medications before your next appointment, please call your pharmacy.  Thank you for choosing [facility]!

## 2015-02-16 NOTE — Progress Notes (Signed)
Cardiology Office Note   Date:  02/16/2015   ID:  Alexis Dyer, DOB 1923-04-07, MRN [id number]  PCP:  Alexis Boston, MD  Cardiologist:  Alexis Sizer, NP   Chief Complaint  Patient presents with  . Atrial Fibrillation  . Coronary Artery Disease      History of Present Illness: Alexis Dyer is a 82 y.o. female who presents for assessment and management of atrial fibrillation, CAD, and diastolic CHF, with admissions to [facility] for decompensation. She was last seen by Dr. Purvis Dyer on 01/19/2015, at which time she was symptomatically stable and continued on current medication regimen.     And she, today, stating that she has swelling in her lower extremities, having more trouble breathing, walking less than 50 feet, with generalized fatigue.  She also states that she is not urinating as much as she used to.  She is also drinking 2 20 ounce containers of water a day.  She is avoiding salt.  She states she normally takes Lasix 20 mg daily, and wears a incontinent pads, and is normally saturated, that she is not having any of that happened lately.  She weighs herself and states, that she has gained approximately 2-3 pounds.  She also states that when she urinates.  She has some burning.   Past Medical History  Diagnosis Date  . SOB (shortness of breath)   . Hypertension 06/2011    2D Echo EF>55% mild to moderate tricuspid regurgitation and mild to moderate pulmonary hypertension.  . Ischemia 10/2011    stress test EF 67% with no ischemia  . Dyslipidemia   . Hypothyroidism   . CAD (coronary artery disease)     bypass in 1996 2D Echo preformed EF >55%  . Edema 06/22/2011    lower arterial for claudication  . Dyspnea 10/11/2011    stress test EF 67% normal stress test.  . Back pain     Past Surgical History  Procedure Laterality Date  . Coronary artery bypass graft  1996    LIMA to the obtuse marginal, saphenous vein graft to the LAD, saphenous  vein graft to the obtuse marginal-1, saphenous vien graft to the posterior diagonal artery performed by Dr Alexis Dyer  . Abdominal hysterectomy    . Cardiac catheterization  11/16/2006  . Cardiac catheterization  04/04/2001  . Cardiac catheterization N/A 05/12/2014    Procedure: Left Heart Cath and Cors/Grafts Angiography;  Surgeon: Alexis Hazel, MD;  Location: [facility] INVASIVE CV LAB;  Service: Cardiovascular;  Laterality: N/A;     Current Outpatient Prescriptions  Medication Sig Dispense Refill  . acetaminophen (TYLENOL) 500 MG tablet Take 500 mg by mouth every 6 (six) hours as needed (pain).    Marland Kitchen ALPRAZolam (XANAX) 0.5 MG tablet Take 1 tablet by mouth daily.    Marland Kitchen amLODipine (NORVASC) 5 MG tablet Take 1 tablet (5 mg total) by mouth daily. 180 tablet 3  . colchicine 0.6 MG tablet Take 0.6 mg by mouth as needed.    Marland Kitchen ELIQUIS 5 MG TABS tablet TAKE 1 TABLET BY MOUTH TWICE DAILY 60 tablet 6  . furosemide (LASIX) 20 MG tablet Take 20-40 mg by mouth daily as needed.    . isosorbide mononitrate (IMDUR) 30 MG 24 hr tablet Take 1 tablet by mouth daily.    Marland Kitchen levothyroxine (SYNTHROID, LEVOTHROID) 100 MCG tablet Take 100 mcg by mouth daily before breakfast.    . metoprolol succinate (TOPROL-XL) 25 MG 24 hr tablet Take 1 tablet (  25 mg total) by mouth 2 (two) times daily. 60 tablet 6  . nitroGLYCERIN (NITROSTAT) 0.4 MG SL tablet Please 1 tablet under the tongue every 5 minutes for up to 3 doses as needed for chest pain. 25 tablet 4  . PROAIR HFA 108 (90 Base) MCG/ACT inhaler Inhale 2 puffs into the lungs every 4 (four) hours as needed.    . ranolazine (RANEXA) 500 MG 12 hr tablet Take 1 tablet (500 mg total) by mouth 2 (two) times daily. 42 tablet 0   No current facility-administered medications for this visit.    Allergies:   Crestor and Penicillins    Social History:  The patient  reports that she has never smoked. She has never used smokeless tobacco. She reports that she does not drink  alcohol or use illicit drugs.   Family History:  The patient's family history includes Cancer in her sister; Heart attack in her father; Heart disease in her sister.    ROS: All other systems are reviewed and negative. Unless otherwise mentioned in H&P    PHYSICAL EXAM: VS:  BP 106/62 mmHg  Pulse 77  Ht 5\' 4"  (1.626 m)  Wt 180 lb (81.647 kg)  BMI 30.88 kg/m2  SpO2 92% , BMI Body mass index is 30.88 kg/(m^2). GEN: Well nourished, well developed, in no acute distress HEENT: normal Neck: no JVD, carotid bruits, or masses Cardiac: IRRR; no murmurs, rubs, or gallops,no edema  Respiratory:  Bibasilar crackles, without wheezes, rhonchi, or coughing MS: no deformity or atrophy Skin: warm and dry, no rash Neuro:  Strength and sensation are intact Psych: euthymic mood, full affect  Recent Labs: 05/12/2014: BUN 24*; Creatinine, Ser 0.70; Hemoglobin 12.6; Platelets 157; Potassium 4.4; Sodium 139    Lipid Panel    Component Value Date/Time   CHOL 211* 09/05/2013 1441   TRIG 200* 09/05/2013 1441   HDL 43 09/05/2013 1441   CHOLHDL 4.9 09/05/2013 1441   VLDL 40 09/05/2013 1441   LDLCALC 128* 09/05/2013 1441      Wt Readings from Last 3 Encounters:  02/16/15 180 lb (81.647 kg)  01/19/15 184 lb (83.462 kg)  12/05/14 193 lb 6.4 oz (87.726 kg)     ASSESSMENT AND PLAN:  1. Acute on chronic diastolic CHF, she states, that she has been putting on fluid weight and having lower extremity edema.  She is also complaining of some dyspnea on exertion.  She does not appear to be significantly fluid overloaded, in fact, her weight is down approximately 13 pounds since being seen in December.  However, she states that her dry weight is running around 176 277 pounds and she is finishing up 280.  She is taking Lasix 20 mg daily and drinking 40 ounces of fluid aday as well, as she states she is thirsty.I have advised her to cut down on her fluid intake.  I have advised her to go up on her Lasix to 40  mm grams daily for the next 2 days and then return back to 20 mg daily.  She is to continue daily weights.  Due to dyspnea and use of diuretic.  I will check a BMET, and a CBC for evaluation of her status.  She will see Dr. Purvis Dyer in 2 weeks.  2. Atrial fibrillation: Heart rate is normal. She continues on ELIQUIS 5 mg twice a day.  I will check a CBC for evaluation of hemoglobin and hematocrit.  3. Dysuria:I would check a UA to rule out UTI.  Current medicines are reviewed at length with the patient today.    Labs/ tests ordered today include: UA, CBC, and BMET.  Orders Placed This Encounter  Procedures  . CBC with Differential  . Basic Metabolic Panel (BMET)  . Urinalysis     Disposition:   FU with Dr. Kirtland Bouchard in 2 weeks.    Signed, Joni Reining, NP  02/16/2015 1:59 PM    [facility] [address], [place], [place] [address] Phone: [phone]; Fax: [phone]

## 2015-02-17 ENCOUNTER — Telehealth: Payer: Self-pay | Admitting: Adult Health

## 2015-02-17 ENCOUNTER — Telehealth: Payer: Self-pay | Admitting: *Deleted

## 2015-02-17 DIAGNOSIS — I519 Heart disease, unspecified: Secondary | ICD-10-CM

## 2015-02-17 LAB — URINALYSIS
Bilirubin Urine: NEGATIVE
GLUCOSE, UA: NEGATIVE
Ketones, ur: NEGATIVE
Nitrite: NEGATIVE
PROTEIN: NEGATIVE
Specific Gravity, Urine: 1.01 (ref 1.001–1.035)
pH: 7 (ref 5.0–8.0)

## 2015-02-17 LAB — CBC WITH DIFFERENTIAL/PLATELET
BASOS PCT: 0 % (ref 0–1)
Basophils Absolute: 0 10*3/uL (ref 0.0–0.1)
EOS PCT: 1 % (ref 0–5)
Eosinophils Absolute: 0.1 10*3/uL (ref 0.0–0.7)
HCT: 34.3 % — ABNORMAL LOW (ref 36.0–46.0)
HEMOGLOBIN: 10.7 g/dL — AB (ref 12.0–15.0)
LYMPHS ABS: 1.4 10*3/uL (ref 0.7–4.0)
LYMPHS PCT: 19 % (ref 12–46)
MCH: 27.1 pg (ref 26.0–34.0)
MCHC: 31.2 g/dL (ref 30.0–36.0)
MCV: 86.8 fL (ref 78.0–100.0)
MONO ABS: 0.7 10*3/uL (ref 0.1–1.0)
MPV: 9.8 fL (ref 8.6–12.4)
Monocytes Relative: 9 % (ref 3–12)
Neutro Abs: 5.2 10*3/uL (ref 1.7–7.7)
Neutrophils Relative %: 71 % (ref 43–77)
PLATELETS: 195 10*3/uL (ref 150–400)
RBC: 3.95 MIL/uL (ref 3.87–5.11)
RDW: 15.5 % (ref 11.5–15.5)
WBC: 7.3 10*3/uL (ref 4.0–10.5)

## 2015-02-17 LAB — BASIC METABOLIC PANEL
BUN: 17 mg/dL (ref 7–25)
CALCIUM: 8.8 mg/dL (ref 8.6–10.4)
CO2: 27 mmol/L (ref 20–31)
CREATININE: 0.96 mg/dL — AB (ref 0.60–0.88)
Chloride: 98 mmol/L (ref 98–110)
Glucose, Bld: 109 mg/dL — ABNORMAL HIGH (ref 65–99)
Potassium: 4.4 mmol/L (ref 3.5–5.3)
SODIUM: 133 mmol/L — AB (ref 135–146)

## 2015-02-17 NOTE — Telephone Encounter (Signed)
Pt's son has a question about home much Lasix she is supposed to be taking, he fixes her medicines for her everyday

## 2015-02-17 NOTE — Telephone Encounter (Signed)
Pt son [name] was calling to see if you wanted his mother to take 60 mg lasix for 2 days. He said she has been taking 40 mg lasix daily for over a month, and at yesterdays visit his other brother had brought her and did not tell you that since the last visit with Dr. Purvis Sheffield she had been doing 40 mg instead of  daily. So, with the extra lasix for 2 days, did you mean for her to take 60 mg daily for 2 days and then resume 40 mg daily. Please advise.

## 2015-02-17 NOTE — Telephone Encounter (Signed)
-----   Message from Jodelle Gross, NP sent at 02/17/2015  7:16 AM EST ----- She has evidence of worsening anemia, with Hgb dropping one gram. Have her follow up with Dr. Sudie Bailey. Have anemia profile drawn. She has no evidence of UTI. She is going up on lasix temporarily to 40 mg daily for two days and then back to 20 mg daily thereafter. Please schedule echocardiogram for LV fx.

## 2015-02-17 NOTE — Telephone Encounter (Signed)
She can take 60 mg today and tomorrow and then go back to 40 mg daily.

## 2015-02-17 NOTE — Telephone Encounter (Signed)
Patient's son Nayomi Tabron notified of med change. Son voiced understanding.

## 2015-02-18 ENCOUNTER — Telehealth: Payer: Self-pay | Admitting: Adult Health

## 2015-02-18 NOTE — Telephone Encounter (Signed)
Patient states she is having tingling all over and doesn't feel right since she has started taking more lasix. / tg

## 2015-02-19 NOTE — Telephone Encounter (Signed)
Spoke to pt and she states that she is feeling better today. She claimed that she could have "twisted a nerve in her back" is why she was tingling. She did state that she took her double dose of lasix this morning and hopefully it wont make her feel bad today, but she would call back if it did.

## 2015-02-24 ENCOUNTER — Telehealth: Payer: Self-pay | Admitting: Cardiovascular Disease

## 2015-02-24 NOTE — Telephone Encounter (Signed)
Patients son called asking about anemic test being scheduled and other test that his mother was told that needed to be scheduled.  They were told at there visit in [place] that they would be contacted to get these test done?   Please call patient or son back in reference to above

## 2015-02-24 NOTE — Telephone Encounter (Signed)
Son advised that patient's echo testing could be done here at the [facility] office and he would be contacted when this is scheduled. Also son advised that patient's lab work was sent to Dr. Jackolyn Confer office on 02/17/15 and that he needed to follow up with his office regarding anemia. Son verbalized understanding of plan.

## 2015-03-03 ENCOUNTER — Encounter: Payer: Self-pay | Admitting: Cardiovascular Disease

## 2015-03-03 ENCOUNTER — Ambulatory Visit (INDEPENDENT_AMBULATORY_CARE_PROVIDER_SITE_OTHER): Payer: Medicare Other | Admitting: Cardiovascular Disease

## 2015-03-03 VITALS — BP 128/72 | HR 82 | Ht 65.0 in | Wt 182.0 lb

## 2015-03-03 DIAGNOSIS — I481 Persistent atrial fibrillation: Secondary | ICD-10-CM | POA: Diagnosis not present

## 2015-03-03 DIAGNOSIS — I4819 Other persistent atrial fibrillation: Secondary | ICD-10-CM

## 2015-03-03 DIAGNOSIS — R0609 Other forms of dyspnea: Secondary | ICD-10-CM | POA: Diagnosis not present

## 2015-03-03 DIAGNOSIS — I10 Essential (primary) hypertension: Secondary | ICD-10-CM | POA: Diagnosis not present

## 2015-03-03 DIAGNOSIS — E785 Hyperlipidemia, unspecified: Secondary | ICD-10-CM

## 2015-03-03 DIAGNOSIS — I5032 Chronic diastolic (congestive) heart failure: Secondary | ICD-10-CM | POA: Diagnosis not present

## 2015-03-03 DIAGNOSIS — I25709 Atherosclerosis of coronary artery bypass graft(s), unspecified, with unspecified angina pectoris: Secondary | ICD-10-CM

## 2015-03-03 NOTE — Patient Instructions (Signed)
Your physician recommends that you continue on your current medications as directed. Please refer to the Current Medication list given to you today. Your physician recommends that you schedule a follow-up appointment in: 6 months with Joni Reining. You will receive a reminder letter in the mail in about 4 months reminding you to call and schedule your appointment. If you don't receive this letter, please contact our office.

## 2015-03-03 NOTE — Progress Notes (Signed)
Patient ID: ASENCION GUISINGER, female   DOB: [birth year], 82 y.o.   MRN: [id number]      SUBJECTIVE: The patient presents for follow-up of acute on chronic diastolic heart failure. Blood work on 02/16/15 showed sodium 133, BUN 17, creatinine 0.96.  Complains of exertional dyspnea but says this is stable.  CXR 01/13/15: Chronic pulmonary venous hypertension and trace bilateral effusions.  Says she's been anxious and was started on an anxiolytic.  Nuclear stress test 05/21/13: Mild anteroapical ischemia, LVEF 62%.    Review of Systems: As per "subjective", otherwise negative.  Allergies  Allergen Reactions  . Crestor [Rosuvastatin] Other (See Comments)    Leg Cramps  . Penicillins Other (See Comments)    Childhood allergy    Current Outpatient Prescriptions  Medication Sig Dispense Refill  . acetaminophen (TYLENOL) 500 MG tablet Take 500 mg by mouth every 6 (six) hours as needed (pain).    Marland Kitchen ALPRAZolam (XANAX) 0.5 MG tablet Take 1 tablet by mouth daily.    Marland Kitchen amLODipine (NORVASC) 5 MG tablet Take 1 tablet (5 mg total) by mouth daily. 180 tablet 3  . colchicine 0.6 MG tablet Take 0.6 mg by mouth as needed.    Marland Kitchen ELIQUIS 5 MG TABS tablet TAKE 1 TABLET BY MOUTH TWICE DAILY 60 tablet 6  . furosemide (LASIX) 20 MG tablet Take 20-40 mg by mouth daily as needed.    . isosorbide mononitrate (IMDUR) 30 MG 24 hr tablet Take 1 tablet by mouth daily.    Marland Kitchen levothyroxine (SYNTHROID, LEVOTHROID) 100 MCG tablet Take 100 mcg by mouth daily before breakfast.    . metoprolol succinate (TOPROL-XL) 25 MG 24 hr tablet Take 1 tablet (25 mg total) by mouth 2 (two) times daily. 60 tablet 6  . nitroGLYCERIN (NITROSTAT) 0.4 MG SL tablet Please 1 tablet under the tongue every 5 minutes for up to 3 doses as needed for chest pain. 25 tablet 4  . PROAIR HFA 108 (90 Base) MCG/ACT inhaler Inhale 2 puffs into the lungs every 4 (four) hours as needed.    . ranolazine (RANEXA) 500 MG 12 hr tablet Take 1 tablet (500 mg  total) by mouth 2 (two) times daily. 42 tablet 0   No current facility-administered medications for this visit.    Past Medical History  Diagnosis Date  . SOB (shortness of breath)   . Hypertension 06/2011    2D Echo EF>55% mild to moderate tricuspid regurgitation and mild to moderate pulmonary hypertension.  . Ischemia 10/2011    stress test EF 67% with no ischemia  . Dyslipidemia   . Hypothyroidism   . CAD (coronary artery disease)     bypass in 1996 2D Echo preformed EF >55%  . Edema 06/22/2011    lower arterial for claudication  . Dyspnea 10/11/2011    stress test EF 67% normal stress test.  . Back pain     Past Surgical History  Procedure Laterality Date  . Coronary artery bypass graft  1996    LIMA to the obtuse marginal, saphenous vein graft to the LAD, saphenous vein graft to the obtuse marginal-1, saphenous vien graft to the posterior diagonal artery performed by Dr Alean Rinne  . Abdominal hysterectomy    . Cardiac catheterization  11/16/2006  . Cardiac catheterization  04/04/2001  . Cardiac catheterization N/A 05/12/2014    Procedure: Left Heart Cath and Cors/Grafts Angiography;  Surgeon: Kathleene Hazel, MD;  Location: [facility] INVASIVE CV LAB;  Service: Cardiovascular;  Laterality: N/A;  Social History   Social History  . Marital Status: Widowed    Spouse Name: N/A  . Number of Children: N/A  . Years of Education: N/A   Occupational History  . Not on file.   Social History Main Topics  . Smoking status: Never Smoker   . Smokeless tobacco: Never Used  . Alcohol Use: No  . Drug Use: No  . Sexual Activity: Not on file   Other Topics Concern  . Not on file   Social History Narrative     Filed Vitals:   03/03/15 1402  BP: 128/72  Pulse: 82  Height:  (1.651 m)  Weight: 182 lb (82.555 kg)  SpO2: 98%    PHYSICAL EXAM General: NAD HEENT: Normal. Neck: No JVD, no thyromegaly. Lungs: Diminished at bases, faint crackles at bases, faint  expiratory wheezes. CV: Regular rate and irregular rhythm, normal S1/S2, no S3, no murmur. No lower extremity edema.  Abdomen: Soft, nontender, no distention.  Neurologic: Alert and oriented.  Psych: Normal affect. Skin: Normal. Musculoskeletal: No gross deformities. Extremities: No clubbing or cyanosis.   ECG: Most recent ECG reviewed.      ASSESSMENT AND PLAN: 1. CAD and CABG: Symptomatically stable. Continue metoprolol succinate, Imdur, and Ranexa. Does not want statin therapy.  2. Essential HTN: Controlled. No changes.  3. Hyperlipidemia: Lipids from 05/2014 reviewed. Prefers to avoid statin therapy.  4. New-onset atrial fibrillation: Continue Eliquis 5 mg twice daily for thromboembolic protection given elevated CHA2DS2VASC score of 5. Continue Toprol-XL 25 mg bid.  5. Chronic diastolic heart failure: Appears euvolemic on Lasix 40 mg daily. Can take an extra 20 mg Lasix for worsening shortness of breath and leg swelling.  Dispo: f/u 6 months with NP.   Prentice Docker, M.D., F.A.C.C.

## 2015-03-05 ENCOUNTER — Other Ambulatory Visit: Payer: Self-pay

## 2015-03-05 ENCOUNTER — Ambulatory Visit (INDEPENDENT_AMBULATORY_CARE_PROVIDER_SITE_OTHER): Payer: Medicare Other

## 2015-03-05 DIAGNOSIS — I519 Heart disease, unspecified: Secondary | ICD-10-CM

## 2015-03-05 DIAGNOSIS — I10 Essential (primary) hypertension: Secondary | ICD-10-CM | POA: Diagnosis not present

## 2015-03-10 ENCOUNTER — Telehealth: Payer: Self-pay | Admitting: Cardiovascular Disease

## 2015-03-10 NOTE — Telephone Encounter (Signed)
Would like to know results of test

## 2015-03-16 ENCOUNTER — Other Ambulatory Visit: Payer: Self-pay | Admitting: *Deleted

## 2015-03-16 MED ORDER — RANOLAZINE ER 500 MG PO TB12: 500.0000 mg | ORAL_TABLET | Freq: Two times a day (BID) | ORAL | Status: DC

## 2015-07-08 ENCOUNTER — Other Ambulatory Visit: Payer: Self-pay | Admitting: Cardiovascular Disease

## 2015-07-21 ENCOUNTER — Ambulatory Visit (INDEPENDENT_AMBULATORY_CARE_PROVIDER_SITE_OTHER): Payer: Medicare Other | Admitting: *Deleted

## 2015-07-21 DIAGNOSIS — I480 Paroxysmal atrial fibrillation: Secondary | ICD-10-CM | POA: Diagnosis not present

## 2015-07-21 DIAGNOSIS — Z5181 Encounter for therapeutic drug level monitoring: Secondary | ICD-10-CM | POA: Diagnosis not present

## 2015-07-21 NOTE — Progress Notes (Signed)
Expand All Collapse All   Pt was started on Eliquis 5mg  bid for atrial fib on 12/05/14 by Dr Purvis SheffieldKoneswaran.   Pt denies problems taking Eliquis.  She has not had any excessive bruising, bleeding or GI upset.  Reviewed patients medication list. Pt is not currently on any combined P-gp and strong CYP3A4 inhibitors/inducers (ketoconazole, traconazole, ritonavir, carbamazepine, phenytoin, rifampin, St. John's wort). Reviewed labs from 07/21/15 @ [facility]. SCr 1.22, Weight 182.6, CrCl 40.07 Dose is appropriate based on 2 out of 3 criteria (age,wt,SCr). Hgb and HCT 11.4/36.0 (Stable)  A full discussion of the nature of anticoagulants has been carried out. A benefit/risk analysis has been presented to the patient, so that they understand the justification for choosing anticoagulation with Eliquis at this time. The need for compliance is stressed. Pt is aware to take the medication twice daily. Side effects of potential bleeding are discussed, including unusual colored urine or stools, coughing up blood or coffee ground emesis, nose bleeds or serious fall or head trauma. Discussed signs and symptoms of stroke. The patient should avoid any OTC items containing aspirin or ibuprofen. Avoid alcohol consumption. Call if any signs of abnormal bleeding. Discussed financial obligations and resolved any difficulty in obtaining medication. Next lab test test in 6 months.

## 2015-08-06 ENCOUNTER — Telehealth: Payer: Self-pay | Admitting: *Deleted

## 2015-08-06 NOTE — Telephone Encounter (Signed)
-----   Message from Laqueta Linden, MD sent at 08/03/2015  2:51 PM EDT ----- Ok.

## 2015-08-10 NOTE — Telephone Encounter (Signed)
Patient informed. Copy sent to PCP °

## 2015-08-19 ENCOUNTER — Other Ambulatory Visit: Payer: Self-pay | Admitting: Cardiovascular Disease

## 2015-09-10 ENCOUNTER — Encounter: Payer: Self-pay | Admitting: *Deleted

## 2015-09-14 ENCOUNTER — Ambulatory Visit: Payer: Medicare Other | Admitting: Cardiovascular Disease

## 2015-10-03 ENCOUNTER — Other Ambulatory Visit: Payer: Self-pay | Admitting: Cardiovascular Disease

## 2015-10-05 ENCOUNTER — Other Ambulatory Visit: Payer: Self-pay

## 2015-10-05 MED ORDER — RANOLAZINE ER 500 MG PO TB12: 500.0000 mg | ORAL_TABLET | Freq: Two times a day (BID) | ORAL | 6 refills | Status: DC

## 2015-10-07 ENCOUNTER — Encounter: Payer: Self-pay | Admitting: *Deleted

## 2015-10-08 ENCOUNTER — Ambulatory Visit (INDEPENDENT_AMBULATORY_CARE_PROVIDER_SITE_OTHER): Payer: Medicare Other | Admitting: Cardiovascular Disease

## 2015-10-08 ENCOUNTER — Encounter: Payer: Self-pay | Admitting: Cardiovascular Disease

## 2015-10-08 VITALS — BP 160/78 | HR 68 | Ht 65.0 in | Wt 180.0 lb

## 2015-10-08 DIAGNOSIS — I10 Essential (primary) hypertension: Secondary | ICD-10-CM

## 2015-10-08 DIAGNOSIS — I5032 Chronic diastolic (congestive) heart failure: Secondary | ICD-10-CM

## 2015-10-08 DIAGNOSIS — I481 Persistent atrial fibrillation: Secondary | ICD-10-CM | POA: Diagnosis not present

## 2015-10-08 DIAGNOSIS — I4819 Other persistent atrial fibrillation: Secondary | ICD-10-CM

## 2015-10-08 DIAGNOSIS — I25708 Atherosclerosis of coronary artery bypass graft(s), unspecified, with other forms of angina pectoris: Secondary | ICD-10-CM | POA: Diagnosis not present

## 2015-10-08 DIAGNOSIS — E7800 Pure hypercholesterolemia, unspecified: Secondary | ICD-10-CM

## 2015-10-08 NOTE — Progress Notes (Signed)
SUBJECTIVE: The patient presents for follow-up of chronic diastolic heart failure, atrial fibrillation, and CAD/CABG. Nuclear stress test 05/21/13: Mild anteroapical ischemia, LVEF 62%.  Checks blood pressure at home with systolic readings in the 140 range. Weighs herself daily. Doesn't sleep well. Has left leg pain. Denies chest pain.   Review of Systems: As per "subjective", otherwise negative.  Allergies  Allergen Reactions  . Crestor [Rosuvastatin] Other (See Comments)    Leg Cramps  . Penicillins Other (See Comments)    Childhood allergy    Current Outpatient Prescriptions  Medication Sig Dispense Refill  . acetaminophen (TYLENOL) 500 MG tablet Take 500 mg by mouth every 6 (six) hours as needed (pain).    Marland Kitchen amLODipine (NORVASC) 5 MG tablet Take 1 tablet (5 mg total) by mouth daily. 180 tablet 3  . colchicine 0.6 MG tablet Take 0.6 mg by mouth as needed.    Marland Kitchen ELIQUIS 5 MG TABS tablet TAKE 1 TABLET BY MOUTH TWICE DAILY 60 tablet 6  . furosemide (LASIX) 20 MG tablet Take 20-40 mg by mouth daily as needed.    . isosorbide mononitrate (IMDUR) 30 MG 24 hr tablet Take 1 tablet by mouth daily.    Marland Kitchen levothyroxine (SYNTHROID, LEVOTHROID) 100 MCG tablet Take 100 mcg by mouth daily before breakfast.    . LORazepam (ATIVAN) 1 MG tablet Take 1 mg by mouth 2 (two) times daily.    . metoprolol succinate (TOPROL-XL) 25 MG 24 hr tablet TAKE 1 TABLET BY MOUTH TWICE DAILY 60 tablet 6  . nitroGLYCERIN (NITROSTAT) 0.4 MG SL tablet Please 1 tablet under the tongue every 5 minutes for up to 3 doses as needed for chest pain. 25 tablet 4  . PROAIR HFA 108 (90 Base) MCG/ACT inhaler Inhale 2 puffs into the lungs every 4 (four) hours as needed.    . ranolazine (RANEXA) 500 MG 12 hr tablet Take 1 tablet (500 mg total) by mouth 2 (two) times daily. 60 tablet 6   No current facility-administered medications for this visit.     Past Medical History:  Diagnosis Date  . Back pain   . CAD (coronary  artery disease)    bypass in 1996 2D Echo preformed EF >55%  . Dyslipidemia   . Dyspnea 10/11/2011   stress test EF 67% normal stress test.  . Edema 06/22/2011   lower arterial for claudication  . Hypertension 06/2011   2D Echo EF>55% mild to moderate tricuspid regurgitation and mild to moderate pulmonary hypertension.  . Hypothyroidism   . Ischemia 10/2011   stress test EF 67% with no ischemia  . SOB (shortness of breath)     Past Surgical History:  Procedure Laterality Date  . ABDOMINAL HYSTERECTOMY    . CARDIAC CATHETERIZATION  11/16/2006  . CARDIAC CATHETERIZATION  04/04/2001  . CARDIAC CATHETERIZATION N/A 05/12/2014   Procedure: Left Heart Cath and Cors/Grafts Angiography;  Surgeon: Kathleene Hazel, MD;  Location: [facility] INVASIVE CV LAB;  Service: Cardiovascular;  Laterality: N/A;  . CORONARY ARTERY BYPASS GRAFT  1996   LIMA to the obtuse marginal, saphenous vein graft to the LAD, saphenous vein graft to the obtuse marginal-1, saphenous vien graft to the posterior diagonal artery performed by Dr Alean Rinne    Social History   Social History  . Marital status: Widowed    Spouse name: N/A  . Number of children: N/A  . Years of education: N/A   Occupational History  . Not on file.  Social History Main Topics  . Smoking status: Never Smoker  . Smokeless tobacco: Never Used  . Alcohol use No  . Drug use: No  . Sexual activity: Not on file   Other Topics Concern  . Not on file   Social History Narrative  . No narrative on file     Vitals:   10/08/15 1404  BP: (!) 160/78  Pulse: 68  SpO2: 97%  Weight: 180 lb (81.6 kg)  Height: 5\' 5"  (1.651 m)    PHYSICAL EXAM General: NAD HEENT: Normal. Neck: No JVD, no thyromegaly. Lungs: Diminished at bases, faint expiratory wheezes. CV: Regular rate and irregular rhythm, normal S1/S2, no S3, no murmur. No lower extremity edema.  Abdomen: Soft, nontender, no distention.  Neurologic: Alert and oriented.   Psych: Normal affect. Skin: Normal. Musculoskeletal: No gross deformities. Extremities: No clubbing or cyanosis.     ECG: Most recent ECG reviewed.      ASSESSMENT AND PLAN: 1. CAD and CABG: Symptomatically stable. Continue metoprolol succinate, Imdur, and Ranexa. Does not want statin therapy.  2. Essential HTN: Elevated. Reasonably controlled for age at home with SBP's in 140 range. No changes today.  3. Hyperlipidemia: Prefers to avoid statin therapy.  4. Atrial fibrillation: Continue Eliquis 5 mg twice daily for thromboembolic protection given elevated CHA2DS2VASC score of 5. Continue Toprol-XL 25 mg bid.  5. Chronic diastolic heart failure: Appears euvolemic on Lasix 40 mg daily. Can take an extra 20 mg Lasix for worsening shortness of breath and leg swelling.  Dispo: fu 6 months.  Prentice DockerSuresh Koneswaran, M.D., F.A.C.C.

## 2015-10-08 NOTE — Patient Instructions (Signed)

## 2015-11-21 ENCOUNTER — Other Ambulatory Visit: Payer: Self-pay | Admitting: Cardiovascular Disease

## 2015-12-09 ENCOUNTER — Ambulatory Visit: Payer: Medicare Other | Admitting: Cardiovascular Disease

## 2015-12-10 ENCOUNTER — Ambulatory Visit: Payer: Medicare Other | Admitting: Adult Health

## 2015-12-10 ENCOUNTER — Ambulatory Visit (INDEPENDENT_AMBULATORY_CARE_PROVIDER_SITE_OTHER): Payer: Medicare Other | Admitting: Cardiovascular Disease

## 2015-12-10 ENCOUNTER — Encounter: Payer: Self-pay | Admitting: Cardiovascular Disease

## 2015-12-10 VITALS — BP 148/74 | HR 88 | Ht 65.0 in | Wt 176.0 lb

## 2015-12-10 DIAGNOSIS — R208 Other disturbances of skin sensation: Secondary | ICD-10-CM

## 2015-12-10 DIAGNOSIS — I482 Chronic atrial fibrillation: Secondary | ICD-10-CM | POA: Diagnosis not present

## 2015-12-10 DIAGNOSIS — I10 Essential (primary) hypertension: Secondary | ICD-10-CM | POA: Diagnosis not present

## 2015-12-10 DIAGNOSIS — I4821 Permanent atrial fibrillation: Secondary | ICD-10-CM

## 2015-12-10 DIAGNOSIS — I519 Heart disease, unspecified: Secondary | ICD-10-CM

## 2015-12-10 DIAGNOSIS — E7800 Pure hypercholesterolemia, unspecified: Secondary | ICD-10-CM

## 2015-12-10 DIAGNOSIS — I25708 Atherosclerosis of coronary artery bypass graft(s), unspecified, with other forms of angina pectoris: Secondary | ICD-10-CM

## 2015-12-10 DIAGNOSIS — I5032 Chronic diastolic (congestive) heart failure: Secondary | ICD-10-CM

## 2015-12-10 NOTE — Progress Notes (Signed)
SUBJECTIVE: The patient presents for premature follow-up due to complaints of "burning/tingling sensation" all over her skin which wakes her up at night. She has a h/o chronic diastolic heart failure, atrial fibrillation, and CAD/CABG. Nuclear stress test 05/21/13: Mild anteroapical ischemia, LVEF 62%.  She takes Ativan bid for anxiety. She is tearful describing it.   Review of Systems: As per "subjective", otherwise negative.  Allergies  Allergen Reactions  . Crestor [Rosuvastatin] Other (See Comments)    Leg Cramps  . Penicillins Other (See Comments)    Childhood allergy    Current Outpatient Prescriptions  Medication Sig Dispense Refill  . acetaminophen (TYLENOL) 500 MG tablet Take 500 mg by mouth every 6 (six) hours as needed (pain).    Marland Kitchen. amLODipine (NORVASC) 5 MG tablet TAKE 1 TABLET BY MOUTH DAILY. 90 tablet 3  . colchicine 0.6 MG tablet Take 0.6 mg by mouth as needed.    Marland Kitchen. ELIQUIS 5 MG TABS tablet TAKE 1 TABLET BY MOUTH TWICE DAILY 60 tablet 6  . furosemide (LASIX) 20 MG tablet Take 20-40 mg by mouth daily as needed.    . isosorbide mononitrate (IMDUR) 30 MG 24 hr tablet Take 1 tablet by mouth daily.    Marland Kitchen. levothyroxine (SYNTHROID, LEVOTHROID) 100 MCG tablet Take 100 mcg by mouth daily before breakfast.    . LORazepam (ATIVAN) 1 MG tablet Take 1 mg by mouth 2 (two) times daily.    . metoprolol succinate (TOPROL-XL) 25 MG 24 hr tablet TAKE 1 TABLET BY MOUTH TWICE DAILY 60 tablet 6  . nitroGLYCERIN (NITROSTAT) 0.4 MG SL tablet Please 1 tablet under the tongue every 5 minutes for up to 3 doses as needed for chest pain. 25 tablet 4  . PROAIR HFA 108 (90 Base) MCG/ACT inhaler Inhale 2 puffs into the lungs every 4 (four) hours as needed.    . ranolazine (RANEXA) 500 MG 12 hr tablet Take 1 tablet (500 mg total) by mouth 2 (two) times daily. 60 tablet 6   No current facility-administered medications for this visit.     Past Medical History:  Diagnosis Date  . Back pain     . CAD (coronary artery disease)    bypass in 1996 2D Echo preformed EF >55%  . Dyslipidemia   . Dyspnea 10/11/2011   stress test EF 67% normal stress test.  . Edema 06/22/2011   lower arterial for claudication  . Hypertension 06/2011   2D Echo EF>55% mild to moderate tricuspid regurgitation and mild to moderate pulmonary hypertension.  . Hypothyroidism   . Ischemia 10/2011   stress test EF 67% with no ischemia  . SOB (shortness of breath)     Past Surgical History:  Procedure Laterality Date  . ABDOMINAL HYSTERECTOMY    . CARDIAC CATHETERIZATION  11/16/2006  . CARDIAC CATHETERIZATION  04/04/2001  . CARDIAC CATHETERIZATION N/A 05/12/2014   Procedure: Left Heart Cath and Cors/Grafts Angiography;  Surgeon: Kathleene Hazelhristopher D McAlhany, MD;  [facility];  Service: Cardiovascular;  Laterality: N/A;  . CORONARY ARTERY BYPASS GRAFT  1996   LIMA to the obtuse marginal, saphenous vein graft to the LAD, saphenous vein graft to the obtuse marginal-1, saphenous vien graft to the posterior diagonal artery performed by Dr Alean RinneMichael High    Social History   Social History  . Marital status: Widowed    Spouse name: N/A  . Number of children: N/A  . Years of education: N/A   Occupational History  .  Not on file.   Social History Main Topics  . Smoking status: Never Smoker  . Smokeless tobacco: Never Used  . Alcohol use No  . Drug use: No  . Sexual activity: Not on file   Other Topics Concern  . Not on file   Social History Narrative  . No narrative on file     Vitals:   12/10/15 0854  BP: (!) 148/74  Pulse: 88  SpO2: 96%  Weight: 176 lb (79.8 kg)  Height: 5\' 5"  (1.651 m)    PHYSICAL EXAM General: NAD HEENT: Normal. Neck: No JVD, no thyromegaly. Lungs: Diminished at bases, faint expiratory wheezes. CV: Regular rate and irregular rhythm, normal S1/S2, no S3, no murmur. No lower extremity edema.  Abdomen: Soft, nontender, no distention.  Neurologic: Alert  and oriented.  Psych: Normal affect but tearful at times. Skin: Normal. Musculoskeletal: No gross deformities. Extremities: No clubbing or cyanosis.     ECG: Most recent ECG reviewed.      ASSESSMENT AND PLAN: 1. CAD and CABG: Symptomatically stable. Continue metoprolol succinate, Imdur, and Ranexa. Does not want statin therapy.  2. Essential HTN: Reasonably controlled for age. No changes today.  3. Hyperlipidemia: Prefers to avoid statin therapy.  4. Atrial fibrillation: Continue Eliquis 5 mg twice daily for thromboembolic protection given elevated CHA2DS2VASC score of 5. Continue Toprol-XL 25 mg bid.  5. Chronic diastolic heart failure: Appears euvolemic on Lasix 40 mg daily. Can take an extra 20 mg Lasix for worsening shortness of breath and leg swelling.  6. Burning/tingling sensation over skin: I do not think this is related to a medication, but more likely due to anxiety. I asked her to speak with her PCP about increasing her anxiolytic dose.  Dispo: fu 6 months.   Prentice DockerSuresh Koneswaran, M.D., F.A.C.C.

## 2015-12-10 NOTE — Patient Instructions (Signed)

## 2016-02-03 ENCOUNTER — Other Ambulatory Visit: Payer: Self-pay | Admitting: Cardiovascular Disease

## 2016-03-08 IMAGING — NM NM MYOCAR SINGLE W/SPECT W/WALL MOTION & EF
2 series · 12 of 12 positions shown · non-contrast
Comparison: None.

CLINICAL DATA: 89-year-old woman with a history of CABG who has
been experiencing dyspnea on exertion and fatigue, and is referred
for an ischemic evaluation.

EXAM:
MYOCARDIAL IMAGING WITH SPECT (REST AND PHARMACOLOGIC-STRESS)
GATED LEFT VENTRICULAR WALL MOTION STUDY
LEFT VENTRICULAR EJECTION FRACTION
TECHNIQUE: Standard myocardial SPECT imaging was performed after resting
intravenous injection of 10 mCi 9c-TTm sestamibi. Subsequently,
intravenous infusion of Lexiscan was performed under the supervision
of the Cardiology staff. At peak effect of the drug, 30 mCi 9c-TTm
sestamibi was injected intravenously and standard myocardial SPECT
imaging was performed. Quantitative gated imaging was also performed
to evaluate left ventricular wall motion, and estimate left
ventricular ejection fraction.

[cs cardiac tc hi dose · 6.41mm/px · 6 of 512 frames shown]
[frame 43/512]
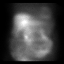
[frame 128/512]
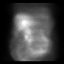
[frame 214/512]
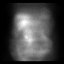
[frame 299/512]
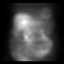
[frame 384/512]
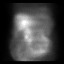
[frame 470/512]
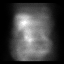

[cr cardiac tc low dose · 6.41mm/px · 6 of 64 frames shown]
[frame 6/64]
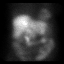
[frame 16/64]
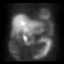
[frame 27/64]
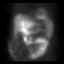
[frame 38/64]
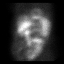
[frame 48/64]
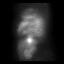
[frame 59/64]
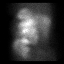

[12 of 12 positions shown; findings below may reference images not displayed]

FINDINGS: The patient was stressed according to the Lexiscan protocol. The
heart rate ranged from 60 up to 75 beats per min. The blood pressure
averaged 151/63. The patient experienced non limiting chest pain.

The resting ECG showed normal sinus rhythm with right bundle branch
block and isolated PVC. With stress, additional isolated PVCs were
noted. There is a diffuse nonspecific ST segment and T-wave
abnormality, with no significant changes with stress.

Analysis of the raw data showed significant breast soft tissue
attenuation. Analysis of the nuclear images demonstrated a moderate
size, moderately severe, anterior wall defect extending from the
apex to the base. There was also significant breast soft tissue
attenuation artifact overlying this region. There appeared to be a
mild degree of reversibility in the anteroapical region, consistent
with a mild degree of ischemia. This region was also mildly
hypokinetic. Septal motion was abnormal consistent with prior
thoracotomy. Left ventricular systolic function was normal,
calculated LVEF 62%.
IMPRESSION: 1.  Abnormal Lexiscan Cardiolite stress test.

2. Mild degree of anteroapical ischemia seen, with overlying
anterior wall soft tissue attenuation artifact also noted.

3.  Normal left ventricular systolic function, calculated LVEF 62%.

## 2016-03-12 ENCOUNTER — Other Ambulatory Visit: Payer: Self-pay | Admitting: Cardiovascular Disease

## 2016-06-08 ENCOUNTER — Ambulatory Visit: Payer: Medicare Other | Admitting: Cardiovascular Disease

## 2016-07-13 ENCOUNTER — Ambulatory Visit (INDEPENDENT_AMBULATORY_CARE_PROVIDER_SITE_OTHER): Payer: Medicare Other | Admitting: Cardiovascular Disease

## 2016-07-13 ENCOUNTER — Encounter: Payer: Self-pay | Admitting: Cardiovascular Disease

## 2016-07-13 VITALS — BP 122/68 | HR 73 | Ht 64.0 in | Wt 183.0 lb

## 2016-07-13 DIAGNOSIS — I519 Heart disease, unspecified: Secondary | ICD-10-CM | POA: Diagnosis not present

## 2016-07-13 DIAGNOSIS — I4821 Permanent atrial fibrillation: Secondary | ICD-10-CM

## 2016-07-13 DIAGNOSIS — I10 Essential (primary) hypertension: Secondary | ICD-10-CM | POA: Diagnosis not present

## 2016-07-13 DIAGNOSIS — I482 Chronic atrial fibrillation: Secondary | ICD-10-CM

## 2016-07-13 DIAGNOSIS — E7800 Pure hypercholesterolemia, unspecified: Secondary | ICD-10-CM | POA: Diagnosis not present

## 2016-07-13 DIAGNOSIS — R0609 Other forms of dyspnea: Secondary | ICD-10-CM | POA: Diagnosis not present

## 2016-07-13 DIAGNOSIS — I25708 Atherosclerosis of coronary artery bypass graft(s), unspecified, with other forms of angina pectoris: Secondary | ICD-10-CM | POA: Diagnosis not present

## 2016-07-13 DIAGNOSIS — I5033 Acute on chronic diastolic (congestive) heart failure: Secondary | ICD-10-CM

## 2016-07-13 DIAGNOSIS — R600 Localized edema: Secondary | ICD-10-CM

## 2016-07-13 MED ORDER — FUROSEMIDE 40 MG PO TABS: 40.0000 mg | ORAL_TABLET | Freq: Every day | ORAL | 6 refills | Status: DC

## 2016-07-13 MED ORDER — POTASSIUM CHLORIDE CRYS ER 20 MEQ PO TBCR: 20.0000 meq | EXTENDED_RELEASE_TABLET | Freq: Every day | ORAL | 6 refills | Status: DC

## 2016-07-13 NOTE — Progress Notes (Signed)
SUBJECTIVE: The patient presents for follow-up of chronic diastolic heart failure, atrial fibrillation, and coronary artery disease with history of CABG (1996).  Nuclear stress test 05/21/13: Mild anteroapical ischemia, LVEF 62%.  ECG performed in the office today which I ordered and personally interpreted demonstrated rate controlled atrial fibrillation with a right bundle-branch block and old anteroseptal infarct.  She complains of bilateral leg swelling. She previously fractured her left leg and it is chronically swollen but she says it is more swollen than normal. She tries to avoid eating sodium rich foods. She denies chest pain. She has chronic exertional dyspnea which is stable. She does not weigh herself daily.   Review of Systems: As per "subjective", otherwise negative.  Allergies  Allergen Reactions  . Crestor [Rosuvastatin] Other (See Comments)    Leg Cramps  . Penicillins Other (See Comments)    Childhood allergy    Current Outpatient Prescriptions  Medication Sig Dispense Refill  . acetaminophen (TYLENOL) 500 MG tablet Take 500 mg by mouth every 6 (six) hours as needed (pain).    . colchicine 0.6 MG tablet Take 0.6 mg by mouth as needed.    Tery Sanfilippo. Docusate Sodium (COLACE PO) Take by mouth.    Everlene Balls. ELIQUIS 5 MG TABS tablet TAKE 1 TABLET BY MOUTH TWICE DAILY 60 tablet 3  . furosemide (LASIX) 20 MG tablet Take 20-40 mg by mouth daily as needed.    . isosorbide mononitrate (IMDUR) 30 MG 24 hr tablet Take 1 tablet by mouth daily.    Marland Kitchen. levothyroxine (SYNTHROID, LEVOTHROID) 100 MCG tablet Take 100 mcg by mouth daily before breakfast.    . LORazepam (ATIVAN) 1 MG tablet Take 1 mg by mouth 2 (two) times daily.    . metoprolol succinate (TOPROL-XL) 25 MG 24 hr tablet TAKE 1 TABLET BY MOUTH TWICE DAILY 60 tablet 6  . nitroGLYCERIN (NITROSTAT) 0.4 MG SL tablet Please 1 tablet under the tongue every 5 minutes for up to 3 doses as needed for chest pain. 25 tablet 4  . PROAIR HFA 108  (90 Base) MCG/ACT inhaler Inhale 2 puffs into the lungs every 4 (four) hours as needed.    . ranitidine (ZANTAC) 300 MG tablet Take 300 mg by mouth 2 (two) times daily.    . ranolazine (RANEXA) 500 MG 12 hr tablet Take 1 tablet (500 mg total) by mouth 2 (two) times daily. 60 tablet 6   No current facility-administered medications for this visit.     Past Medical History:  Diagnosis Date  . Back pain   . CAD (coronary artery disease)    bypass in 1996 2D Echo preformed EF >55%  . Dyslipidemia   . Dyspnea 10/11/2011   stress test EF 67% normal stress test.  . Edema 06/22/2011   lower arterial for claudication  . Hypertension 06/2011   2D Echo EF>55% mild to moderate tricuspid regurgitation and mild to moderate pulmonary hypertension.  . Hypothyroidism   . Ischemia 10/2011   stress test EF 67% with no ischemia  . SOB (shortness of breath)     Past Surgical History:  Procedure Laterality Date  . ABDOMINAL HYSTERECTOMY    . CARDIAC CATHETERIZATION  11/16/2006  . CARDIAC CATHETERIZATION  04/04/2001  . CARDIAC CATHETERIZATION N/A 05/12/2014   Procedure: Left Heart Cath and Cors/Grafts Angiography;  Surgeon: Kathleene Hazelhristopher D McAlhany, MD;  [facility];  Service: Cardiovascular;  Laterality: N/A;  . CORONARY ARTERY BYPASS GRAFT  1996   LIMA  to the obtuse marginal, saphenous vein graft to the LAD, saphenous vein graft to the obtuse marginal-1, saphenous vien graft to the posterior diagonal artery performed by Dr Alean Rinne    Social History   Social History  . Marital status: Widowed    Spouse name: N/A  . Number of children: N/A  . Years of education: N/A   Occupational History  . Not on file.   Social History Main Topics  . Smoking status: Never Smoker  . Smokeless tobacco: Never Used  . Alcohol use No  . Drug use: No  . Sexual activity: Not on file   Other Topics Concern  . Not on file   Social History Narrative  . No narrative on file     Vitals:     07/13/16 1143  BP: 122/68  Pulse: 73  SpO2: 98%  Weight: 183 lb (83 kg)  Height: 5\' 4"  (1.626 m)    Wt Readings from Last 3 Encounters:  07/13/16 183 lb (83 kg)  12/10/15 176 lb (79.8 kg)  10/08/15 180 lb (81.6 kg)     PHYSICAL EXAM General: NAD HEENT: Normal. Neck: No JVD, no thyromegaly. Lungs: Clear to auscultation bilaterally with normal respiratory effort. CV: Nondisplaced PMI.  Regular rate and irregular rhythm, normal S1/S2, no S3, no murmur. 1+ pitting bilateral pretibial and periankle edema.   Abdomen: Soft, nontender, no distention.  Neurologic: Alert and oriented.  Psych: Normal affect. Musculoskeletal: No gross deformities.    ECG: Most recent ECG reviewed.   Labs: Lab Results  Component Value Date/Time   K 4.4 02/16/2015 02:28 PM   BUN 17 02/16/2015 02:28 PM   CREATININE 0.96 (H) 02/16/2015 02:28 PM   ALT 13 09/05/2013 02:41 PM   HGB 10.7 (L) 02/16/2015 02:28 PM     Lipids: Lab Results  Component Value Date/Time   LDLCALC 128 (H) 09/05/2013 02:41 PM   CHOL 211 (H) 09/05/2013 02:41 PM   TRIG 200 (H) 09/05/2013 02:41 PM   HDL 43 09/05/2013 02:41 PM       ASSESSMENT AND PLAN:  1. CAD and CABG: Symptomatically stable. Continue metoprolol succinate, Imdur, and Ranexa. Does not want statin therapy.  2. Essential HTN: Controlled for age. Monitor given diuretic requirement.  3. Hyperlipidemia: Prefers to avoid statin therapy.  4. Atrial fibrillation: Continue Eliquis 5 mg twice daily for thromboembolic protection given elevated CHA2DS2VASC score of 5. Continue Toprol-XL 25 mg bid.  5. Acute on chronic diastolic heart failure with bilateral leg edema: I will increase Lasix to 40 mg twice daily for 4 days and then resume 40 mg daily. I will provide supplemental potassium chloride 20 mEq daily and check a basic metabolic panel. I have asked her to weigh herself daily. I have encouraged a low-sodium diet.   Disposition: Follow up 6  weeks   Prentice Docker, M.D., F.A.C.C.

## 2016-07-13 NOTE — Patient Instructions (Signed)
Medication Instructions:   Increase Lasix to 40mg  twice a day x 4 days, then 40mg  daily thereafter.  Begin Potassium 20meq daily.  Continue all other medications.    Labwork:  BMET - order given today.  Due this Friday, 07/15/2016.  Office will contact with results via phone or letter.    Testing/Procedures: none  Follow-Up: 6 weeks   Any Other Special Instructions Will Be Listed Below (If Applicable). Keep log of daily weights.    If you need a refill on your cardiac medications before your next appointment, please call your pharmacy.

## 2016-07-18 ENCOUNTER — Telehealth: Payer: Self-pay | Admitting: *Deleted

## 2016-07-18 NOTE — Telephone Encounter (Signed)
Notes recorded by Lesle ChrisHill, Ciel Chervenak G, LPN on 7/84/69627/16/2018 at 5:24 PM EDT Patient notified. Copy to pmd. ------  Notes recorded by Laqueta LindenKoneswaran, Suresh A, MD on 07/18/2016 at 12:43 PM EDT ok

## 2016-07-29 ENCOUNTER — Other Ambulatory Visit: Payer: Self-pay | Admitting: Cardiovascular Disease

## 2016-08-24 ENCOUNTER — Other Ambulatory Visit: Payer: Self-pay | Admitting: Cardiovascular Disease

## 2016-08-30 ENCOUNTER — Ambulatory Visit (INDEPENDENT_AMBULATORY_CARE_PROVIDER_SITE_OTHER): Payer: Medicare Other | Admitting: Cardiovascular Disease

## 2016-08-30 VITALS — BP 157/79 | HR 69 | Ht 64.0 in | Wt 181.0 lb

## 2016-08-30 DIAGNOSIS — R600 Localized edema: Secondary | ICD-10-CM

## 2016-08-30 DIAGNOSIS — I5033 Acute on chronic diastolic (congestive) heart failure: Secondary | ICD-10-CM | POA: Diagnosis not present

## 2016-08-30 DIAGNOSIS — I10 Essential (primary) hypertension: Secondary | ICD-10-CM | POA: Diagnosis not present

## 2016-08-30 DIAGNOSIS — I482 Chronic atrial fibrillation: Secondary | ICD-10-CM | POA: Diagnosis not present

## 2016-08-30 DIAGNOSIS — I25708 Atherosclerosis of coronary artery bypass graft(s), unspecified, with other forms of angina pectoris: Secondary | ICD-10-CM | POA: Diagnosis not present

## 2016-08-30 DIAGNOSIS — R040 Epistaxis: Secondary | ICD-10-CM | POA: Diagnosis not present

## 2016-08-30 DIAGNOSIS — E7800 Pure hypercholesterolemia, unspecified: Secondary | ICD-10-CM | POA: Diagnosis not present

## 2016-08-30 DIAGNOSIS — I4821 Permanent atrial fibrillation: Secondary | ICD-10-CM

## 2016-08-30 MED ORDER — FUROSEMIDE 40 MG PO TABS: 40.0000 mg | ORAL_TABLET | Freq: Two times a day (BID) | ORAL | 6 refills | Status: DC

## 2016-08-30 NOTE — Patient Instructions (Addendum)
Medication Instructions:   Increase Lasix to 40mg  twice a day.  Continue all other medications.    Labwork:  BMET - order given today.  Do this Friday, 09/02/2016.  Office will contact with results via phone or letter.    Testing/Procedures: none  Follow-Up: 3 months   Any Other Special Instructions Will Be Listed Below (If Applicable). Weigh daily.  If you need a refill on your cardiac medications before your next appointment, please call your pharmacy.

## 2016-08-30 NOTE — Progress Notes (Signed)
SUBJECTIVE: The patient presents for follow-up of acute on chronic diastolic heart failure, atrial fibrillation, and coronary artery disease with history of CABG (1996). She previously fractured her left leg and it is chronically swollen.  Nuclear stress test 05/21/13: Mild anteroapical ischemia, LVEF 62%.  She is taking Lasix 40 mg every morning. Her legs remain swollen. She does not weigh herself daily. She also complains of occasional epistaxis at night. She uses nasal saline irrigation.   Review of Systems: As per "subjective", otherwise negative.  Allergies  Allergen Reactions  . Crestor [Rosuvastatin] Other (See Comments)    Leg Cramps  . Penicillins Other (See Comments)    Childhood allergy    Current Outpatient Prescriptions  Medication Sig Dispense Refill  . acetaminophen (TYLENOL) 500 MG tablet Take 500 mg by mouth every 6 (six) hours as needed (pain).    Marland Kitchen amLODipine (NORVASC) 5 MG tablet Take 2.5 mg by mouth daily.    . colchicine 0.6 MG tablet Take 0.6 mg by mouth as needed.    Tery Sanfilippo Sodium (COLACE PO) Take by mouth.    Everlene Balls 5 MG TABS tablet TAKE 1 TABLET BY MOUTH TWICE DAILY 60 tablet 6  . furosemide (LASIX) 40 MG tablet Take 1 tablet (40 mg total) by mouth daily. 30 tablet 6  . isosorbide mononitrate (IMDUR) 30 MG 24 hr tablet Take 1 tablet by mouth daily.    Marland Kitchen levothyroxine (SYNTHROID, LEVOTHROID) 100 MCG tablet Take 100 mcg by mouth daily before breakfast.    . LORazepam (ATIVAN) 1 MG tablet Take 1 mg by mouth 2 (two) times daily.    . metoprolol succinate (TOPROL-XL) 25 MG 24 hr tablet TAKE 1 TABLET BY MOUTH TWICE DAILY 60 tablet 0  . nitroGLYCERIN (NITROSTAT) 0.4 MG SL tablet Please 1 tablet under the tongue every 5 minutes for up to 3 doses as needed for chest pain. 25 tablet 4  . potassium chloride SA (K-DUR,KLOR-CON) 20 MEQ tablet Take 1 tablet (20 mEq total) by mouth daily. 30 tablet 6  . PROAIR HFA 108 (90 Base) MCG/ACT inhaler Inhale 2  puffs into the lungs every 4 (four) hours as needed.    . ranitidine (ZANTAC) 300 MG tablet Take 150 mg by mouth 2 (two) times daily.     . ranolazine (RANEXA) 500 MG 12 hr tablet Take 1 tablet (500 mg total) by mouth 2 (two) times daily. 60 tablet 6   No current facility-administered medications for this visit.     Past Medical History:  Diagnosis Date  . Back pain   . CAD (coronary artery disease)    bypass in 1996 2D Echo preformed EF >55%  . Dyslipidemia   . Dyspnea 10/11/2011   stress test EF 67% normal stress test.  . Edema 06/22/2011   lower arterial for claudication  . Hypertension 06/2011   2D Echo EF>55% mild to moderate tricuspid regurgitation and mild to moderate pulmonary hypertension.  . Hypothyroidism   . Ischemia 10/2011   stress test EF 67% with no ischemia  . SOB (shortness of breath)     Past Surgical History:  Procedure Laterality Date  . ABDOMINAL HYSTERECTOMY    . CARDIAC CATHETERIZATION  11/16/2006  . CARDIAC CATHETERIZATION  04/04/2001  . CARDIAC CATHETERIZATION N/A 05/12/2014   Procedure: Left Heart Cath and Cors/Grafts Angiography;  Surgeon: Kathleene Hazel, MD;  Location: [facility] INVASIVE CV LAB;  Service: Cardiovascular;  Laterality: N/A;  . CORONARY ARTERY BYPASS GRAFT  1996   LIMA to the obtuse marginal, saphenous vein graft to the LAD, saphenous vein graft to the obtuse marginal-1, saphenous vien graft to the posterior diagonal artery performed by Dr Alean Rinne    Social History   Social History  . Marital status: Widowed    Spouse name: N/A  . Number of children: N/A  . Years of education: N/A   Occupational History  . Not on file.   Social History Main Topics  . Smoking status: Never Smoker  . Smokeless tobacco: Never Used  . Alcohol use No  . Drug use: No  . Sexual activity: Not on file   Other Topics Concern  . Not on file   Social History Narrative  . No narrative on file     Vitals:   08/30/16 1146  BP: (!) 157/79    Pulse: 69  SpO2: 94%  Weight: 181 lb (82.1 kg)  Height: 5\' 4"  (1.626 m)    Wt Readings from Last 3 Encounters:  08/30/16 181 lb (82.1 kg)  07/13/16 183 lb (83 kg)  12/10/15 176 lb (79.8 kg)     PHYSICAL EXAM General: NAD HEENT: Normal. Neck: No JVD, no thyromegaly. Lungs: Clear to auscultation bilaterally with normal respiratory effort. CV: Regular rate and irregular rhythm, normal S1/S2, no S3, no murmur. 1+ pitting bilateral pretibial and periankle edema.   Abdomen: Soft, nontender, no distention.  Neurologic: Alert and oriented.  Psych: Normal affect. Skin: Normal. Musculoskeletal: No gross deformities.    ECG: Most recent ECG reviewed.   Labs: Lab Results  Component Value Date/Time   K 4.4 02/16/2015 02:28 PM   BUN 17 02/16/2015 02:28 PM   CREATININE 0.96 (H) 02/16/2015 02:28 PM   ALT 13 09/05/2013 02:41 PM   HGB 10.7 (L) 02/16/2015 02:28 PM     Lipids: Lab Results  Component Value Date/Time   LDLCALC 128 (H) 09/05/2013 02:41 PM   CHOL 211 (H) 09/05/2013 02:41 PM   TRIG 200 (H) 09/05/2013 02:41 PM   HDL 43 09/05/2013 02:41 PM       ASSESSMENT AND PLAN: 1. CAD and CABG: Symptomatically stable. Continue metoprolol succinate, Imdur, and Ranexa. Does not want statin therapy.  2. Essential HTN: Elevated. I will monitor given increase in diuretic dose.  3. Hyperlipidemia: Prefers to avoid statin therapy.  4. Atrial fibrillation: Continue Eliquis 5 mg twice daily for thromboembolic protection given elevated CHA2DS2VASC score of 5. Continue Toprol-XL 25 mg bid.  5. Acute on chronic diastolic heart failure with bilateral leg edema: Wt down 2 lbs from 07/13/16 but legs remain swollen. I will increase Lasix to 40 mg twice daily indefinitely and check a basic metabolic panel on 8/31. I have asked her to weigh herself daily. I have encouraged a low-sodium diet.  6. Epistaxis: I have encouraged her to purchase a humidifier. If epistaxis persists, an ENT  evaluation could be considered for cauterization if needed.    Disposition: Follow up 3 months.   Prentice Docker, M.D., F.A.C.C.

## 2016-09-06 ENCOUNTER — Telehealth: Payer: Self-pay | Admitting: *Deleted

## 2016-09-06 DIAGNOSIS — R7989 Other specified abnormal findings of blood chemistry: Secondary | ICD-10-CM

## 2016-09-06 MED ORDER — FUROSEMIDE 40 MG PO TABS: ORAL_TABLET | ORAL | Status: DC

## 2016-09-06 NOTE — Telephone Encounter (Signed)
Notes recorded by Lesle ChrisHill, Deshanna Kama G, LPN on 1/6/10969/04/2016 at 9:31 AM EDT Son Delanna Ahmadi(Mike Cashatt) & patient notified. Copy to pmd. Follow up scheduled for November. Lab order mailed to patient. [facility]. ------  Notes recorded by Laqueta LindenKoneswaran, Suresh A, MD on 09/05/2016 at 9:33 AM EDT Worsening kidney function due to increased (but necessary) diuretic dose. Change Lasix to 40 mg and 80 mg on alternate days. Then repeat BMET in 3 weeks.

## 2016-09-26 ENCOUNTER — Other Ambulatory Visit: Payer: Self-pay | Admitting: Cardiovascular Disease

## 2016-10-07 ENCOUNTER — Telehealth: Payer: Self-pay | Admitting: *Deleted

## 2016-10-07 NOTE — Telephone Encounter (Signed)
Notes recorded by Lesle Chris, LPN on 16/01/958 at 3:34 PM EDT Patient notified. Copy to pmd. Follow up already scheduled for 11/30/2016. ------  Notes recorded by Lesle Chris, LPN on 4/54/0981 at 11:48 AM EDT Busy. ------  Notes recorded by Laqueta Linden, MD on 09/26/2016 at 3:48 PM EDT Stable.

## 2016-10-24 ENCOUNTER — Other Ambulatory Visit: Payer: Self-pay | Admitting: *Deleted

## 2016-10-24 MED ORDER — RANOLAZINE ER 500 MG PO TB12: 500.0000 mg | ORAL_TABLET | Freq: Two times a day (BID) | ORAL | 6 refills | Status: DC

## 2016-11-30 ENCOUNTER — Encounter: Payer: Self-pay | Admitting: Cardiovascular Disease

## 2016-11-30 ENCOUNTER — Ambulatory Visit (INDEPENDENT_AMBULATORY_CARE_PROVIDER_SITE_OTHER): Payer: Medicare Other | Admitting: Cardiovascular Disease

## 2016-11-30 ENCOUNTER — Other Ambulatory Visit: Payer: Self-pay

## 2016-11-30 VITALS — BP 130/80 | HR 66 | Ht 65.0 in | Wt 177.0 lb

## 2016-11-30 DIAGNOSIS — R040 Epistaxis: Secondary | ICD-10-CM | POA: Diagnosis not present

## 2016-11-30 DIAGNOSIS — I25708 Atherosclerosis of coronary artery bypass graft(s), unspecified, with other forms of angina pectoris: Secondary | ICD-10-CM | POA: Diagnosis not present

## 2016-11-30 DIAGNOSIS — I5032 Chronic diastolic (congestive) heart failure: Secondary | ICD-10-CM | POA: Diagnosis not present

## 2016-11-30 DIAGNOSIS — E7800 Pure hypercholesterolemia, unspecified: Secondary | ICD-10-CM | POA: Diagnosis not present

## 2016-11-30 DIAGNOSIS — I10 Essential (primary) hypertension: Secondary | ICD-10-CM | POA: Diagnosis not present

## 2016-11-30 DIAGNOSIS — I4821 Permanent atrial fibrillation: Secondary | ICD-10-CM

## 2016-11-30 DIAGNOSIS — R600 Localized edema: Secondary | ICD-10-CM

## 2016-11-30 DIAGNOSIS — I482 Chronic atrial fibrillation: Secondary | ICD-10-CM | POA: Diagnosis not present

## 2016-11-30 NOTE — Patient Instructions (Signed)
Medication Instructions:  Your physician recommends that you continue on your current medications as directed. Please refer to the Current Medication list given to you today.  Labwork: NONE  Testing/Procedures: NONE  Follow-Up: Your physician wants you to follow-up in: 6 MONTHS WITH DR. KONESWARAN. You will receive a reminder letter in the mail two months in advance. If you don't receive a letter, please call our office to schedule the follow-up appointment.  Any Other Special Instructions Will Be Listed Below (If Applicable).  If you need a refill on your cardiac medications before your next appointment, please call your pharmacy. 

## 2016-11-30 NOTE — Progress Notes (Signed)
SUBJECTIVE: The patient presents for follow-up of acute on chronic diastolic heart failure, atrial fibrillation, and coronary artery disease with history of CABG (1996). She previously fractured her left leg and it is chronically swollen.  Nuclear stress test 05/21/13: Mild anteroapical ischemia, LVEF 62%.  She is here with her son.  She had a fall due to an unlocked break on her walker and bruised her left eye and chest wall.  She apparently did not sustain any fractures.  She denies increase of chronic left leg swelling and there has been no swelling of her right leg.  She denies chest pain, palpitations, shortness of breath.   Review of Systems: As per "subjective", otherwise negative.  Allergies  Allergen Reactions  . Crestor [Rosuvastatin] Other (See Comments)    Leg Cramps  . Penicillins Other (See Comments)    Childhood allergy    Current Outpatient Medications  Medication Sig Dispense Refill  . acetaminophen (TYLENOL) 500 MG tablet Take 500 mg by mouth every 6 (six) hours as needed (pain).    Marland Kitchen. amLODipine (NORVASC) 5 MG tablet Take 2.5 mg by mouth daily.    . colchicine 0.6 MG tablet Take 0.6 mg by mouth as needed.    Tery Sanfilippo. Docusate Sodium (COLACE PO) Take by mouth.    Everlene Balls. ELIQUIS 5 MG TABS tablet TAKE 1 TABLET BY MOUTH TWICE DAILY 60 tablet 6  . furosemide (LASIX) 40 MG tablet Take one tab (40mg ) by mouth alternating with two tabs (80mg ) every other day    . isosorbide mononitrate (IMDUR) 30 MG 24 hr tablet Take 1 tablet by mouth daily.    Marland Kitchen. levothyroxine (SYNTHROID, LEVOTHROID) 100 MCG tablet Take 100 mcg by mouth daily before breakfast.    . LORazepam (ATIVAN) 1 MG tablet Take 1 mg by mouth 2 (two) times daily.    . metoprolol succinate (TOPROL-XL) 25 MG 24 hr tablet TAKE ONE TABLET BY MOUTH TWICE DAILY 60 tablet 3  . nitroGLYCERIN (NITROSTAT) 0.4 MG SL tablet Please 1 tablet under the tongue every 5 minutes for up to 3 doses as needed for chest pain. 25 tablet 4  .  potassium chloride SA (K-DUR,KLOR-CON) 20 MEQ tablet Take 1 tablet (20 mEq total) by mouth daily. 30 tablet 6  . PROAIR HFA 108 (90 Base) MCG/ACT inhaler Inhale 2 puffs into the lungs every 4 (four) hours as needed.    . ranitidine (ZANTAC) 300 MG tablet Take 150 mg by mouth 2 (two) times daily.     . ranolazine (RANEXA) 500 MG 12 hr tablet Take 1 tablet (500 mg total) by mouth 2 (two) times daily. 60 tablet 6   No current facility-administered medications for this visit.     Past Medical History:  Diagnosis Date  . Back pain   . CAD (coronary artery disease)    bypass in 1996 2D Echo preformed EF >55%  . Dyslipidemia   . Dyspnea 10/11/2011   stress test EF 67% normal stress test.  . Edema 06/22/2011   lower arterial for claudication  . Hypertension 06/2011   2D Echo EF>55% mild to moderate tricuspid regurgitation and mild to moderate pulmonary hypertension.  . Hypothyroidism   . Ischemia 10/2011   stress test EF 67% with no ischemia  . SOB (shortness of breath)     Past Surgical History:  Procedure Laterality Date  . ABDOMINAL HYSTERECTOMY    . CARDIAC CATHETERIZATION  11/16/2006  . CARDIAC CATHETERIZATION  04/04/2001  . CARDIAC CATHETERIZATION  N/A 05/12/2014   Procedure: Left Heart Cath and Cors/Grafts Angiography;  Surgeon: Kathleene Hazelhristopher D McAlhany, MD;  [facility];  Service: Cardiovascular;  Laterality: N/A;  . CORONARY ARTERY BYPASS GRAFT  1996   LIMA to the obtuse marginal, saphenous vein graft to the LAD, saphenous vein graft to the obtuse marginal-1, saphenous vien graft to the posterior diagonal artery performed by Dr Alean RinneMichael High    Social History   Socioeconomic History  . Marital status: Widowed    Spouse name: Not on file  . Number of children: Not on file  . Years of education: Not on file  . Highest education level: Not on file  Social Needs  . Financial resource strain: Not on file  . Food insecurity - worry: Not on file  . Food insecurity  - inability: Not on file  . Transportation needs - medical: Not on file  . Transportation needs - non-medical: Not on file  Occupational History  . Not on file  Tobacco Use  . Smoking status: Never Smoker  . Smokeless tobacco: Never Used  Substance and Sexual Activity  . Alcohol use: No    Alcohol/week: 0.0 oz  . Drug use: No  . Sexual activity: Not on file  Other Topics Concern  . Not on file  Social History Narrative  . Not on file     Vitals:   11/30/16 1259  BP: 130/80  Pulse: 66  SpO2: 97%  Weight: 177 lb (80.3 kg)  Height: 5\' 5"  (1.651 m)    Wt Readings from Last 3 Encounters:  11/30/16 177 lb (80.3 kg)  08/30/16 181 lb (82.1 kg)  07/13/16 183 lb (83 kg)     PHYSICAL EXAM General: NAD HEENT: Ecchymosis around left eye. Neck: No JVD, no thyromegaly. Lungs: Clear to auscultation bilaterally with normal respiratory effort. CV: Regular rate and irregularrhythm, normal S1/S2, no S3, no murmur. 1+ pitting bilateral pretibial andperiankle edema.   Abdomen: Soft, nontender, no distention.  Neurologic: Alert and oriented.  Psych: Normal affect. Skin: Ecchymosis around left eye. Musculoskeletal: No gross deformities.    ECG: Most recent ECG reviewed.   Labs: Lab Results  Component Value Date/Time   K 4.4 02/16/2015 02:28 PM   BUN 17 02/16/2015 02:28 PM   CREATININE 0.96 (H) 02/16/2015 02:28 PM   ALT 13 09/05/2013 02:41 PM   HGB 10.7 (L) 02/16/2015 02:28 PM     Lipids: Lab Results  Component Value Date/Time   LDLCALC 128 (H) 09/05/2013 02:41 PM   CHOL 211 (H) 09/05/2013 02:41 PM   TRIG 200 (H) 09/05/2013 02:41 PM   HDL 43 09/05/2013 02:41 PM       ASSESSMENT AND PLAN: 1. CAD and CABG: Symptomatically stable. Continue metoprolol succinate, Imdur, and Ranexa. Does not want statin therapy.  No aspirin as she is on Eliquis.  2. Essential HTN:  Controlled.  No change in therapy.  3. Hyperlipidemia: Prefers to avoid statin therapy.  4.  Atrial fibrillation: Continue Eliquis 5 mg twice daily for thromboembolic protection given elevated CHA2DS2VASC score of 5. Continue Toprol-XL 25 mg bid.  5. Chronic diastolic heart failurewith bilateral leg edema: Wt down 4 lbs from 08/30/16.  Continue current diuretic regimen.     Disposition: Follow up 6 months   Prentice DockerSuresh Koneswaran, M.D., F.A.C.C.

## 2016-12-15 ENCOUNTER — Other Ambulatory Visit: Payer: Self-pay | Admitting: Cardiovascular Disease

## 2016-12-16 ENCOUNTER — Other Ambulatory Visit: Payer: Self-pay | Admitting: Cardiovascular Disease

## 2017-01-04 ENCOUNTER — Other Ambulatory Visit: Payer: Self-pay | Admitting: Cardiovascular Disease

## 2017-02-05 ENCOUNTER — Other Ambulatory Visit: Payer: Self-pay | Admitting: Cardiovascular Disease

## 2017-02-20 ENCOUNTER — Ambulatory Visit (INDEPENDENT_AMBULATORY_CARE_PROVIDER_SITE_OTHER): Payer: Medicare Other | Admitting: Otolaryngology

## 2017-02-20 DIAGNOSIS — R040 Epistaxis: Secondary | ICD-10-CM

## 2017-02-20 DIAGNOSIS — M950 Acquired deformity of nose: Secondary | ICD-10-CM

## 2017-03-05 ENCOUNTER — Other Ambulatory Visit: Payer: Self-pay | Admitting: Cardiovascular Disease

## 2017-03-08 ENCOUNTER — Other Ambulatory Visit: Payer: Self-pay | Admitting: Cardiovascular Disease

## 2017-05-04 ENCOUNTER — Other Ambulatory Visit: Payer: Self-pay | Admitting: Cardiovascular Disease

## 2017-06-04 ENCOUNTER — Other Ambulatory Visit: Payer: Self-pay | Admitting: Cardiovascular Disease

## 2017-06-05 ENCOUNTER — Ambulatory Visit: Payer: Medicare Other | Admitting: Cardiovascular Disease

## 2017-07-18 ENCOUNTER — Other Ambulatory Visit: Payer: Self-pay | Admitting: Cardiovascular Disease

## 2017-07-23 ENCOUNTER — Other Ambulatory Visit: Payer: Self-pay | Admitting: Cardiovascular Disease

## 2017-07-24 ENCOUNTER — Other Ambulatory Visit: Payer: Self-pay | Admitting: Cardiovascular Disease

## 2017-07-24 MED ORDER — FUROSEMIDE 40 MG PO TABS: ORAL_TABLET | ORAL | 1 refills | Status: DC

## 2017-07-24 NOTE — Telephone Encounter (Signed)
Patient is taking Lasix 40 mg three times daily.  [facility].

## 2017-07-24 NOTE — Telephone Encounter (Signed)
Pt son was actually the one that called [facility] pt has dementia. Almost out of lasix - [facility] and scheduled pt with Dr Purvis SheffieldKoneswaran 9/16 for f/u

## 2017-08-20 ENCOUNTER — Other Ambulatory Visit: Payer: Self-pay | Admitting: Cardiovascular Disease

## 2017-08-29 ENCOUNTER — Other Ambulatory Visit: Payer: Self-pay | Admitting: Cardiovascular Disease

## 2017-09-07 ENCOUNTER — Other Ambulatory Visit: Payer: Self-pay | Admitting: Cardiovascular Disease

## 2017-09-08 ENCOUNTER — Encounter: Payer: Self-pay | Admitting: *Deleted

## 2017-09-15 ENCOUNTER — Encounter: Payer: Self-pay | Admitting: *Deleted

## 2017-09-18 ENCOUNTER — Ambulatory Visit: Payer: Medicare Other | Admitting: Cardiovascular Disease

## 2017-09-18 ENCOUNTER — Encounter: Payer: Self-pay | Admitting: *Deleted

## 2017-09-18 ENCOUNTER — Encounter: Payer: Self-pay | Admitting: Cardiovascular Disease

## 2017-09-18 VITALS — BP 153/80 | HR 70 | Ht 65.0 in | Wt 157.6 lb

## 2017-09-18 DIAGNOSIS — I10 Essential (primary) hypertension: Secondary | ICD-10-CM

## 2017-09-18 DIAGNOSIS — I5032 Chronic diastolic (congestive) heart failure: Secondary | ICD-10-CM

## 2017-09-18 DIAGNOSIS — I482 Chronic atrial fibrillation: Secondary | ICD-10-CM | POA: Diagnosis not present

## 2017-09-18 DIAGNOSIS — E785 Hyperlipidemia, unspecified: Secondary | ICD-10-CM

## 2017-09-18 DIAGNOSIS — I25708 Atherosclerosis of coronary artery bypass graft(s), unspecified, with other forms of angina pectoris: Secondary | ICD-10-CM | POA: Diagnosis not present

## 2017-09-18 DIAGNOSIS — I4821 Permanent atrial fibrillation: Secondary | ICD-10-CM

## 2017-09-18 NOTE — Patient Instructions (Signed)

## 2017-09-18 NOTE — Progress Notes (Signed)
SUBJECTIVE: The patient presents for follow-up of coronary artery disease, atrial fibrillation, and chronic diastolic heart failure.  She underwent CABG in 1996. She previously fractured her left leg and it is chronically swollen.  Nuclear stress test 05/21/13: Mild anteroapical ischemia, LVEF 62%.  She is here with her son.  She denies chest pain.  It appears she was hospitalized in April 2019 at [facility] with internal bleeding and Eliquis was stopped.  She received transfusions at that time.  Since then she has had no further bleeding.  She has intermittent exertional dyspnea.  She has diffuse arthritic pain in her legs and arms.  Her appetite is fair as per her son.  She does not get much by way of activity.  She is not interested in further cardiac invasive procedures.   Review of Systems: As per "subjective", otherwise negative.  Allergies  Allergen Reactions  . Crestor [Rosuvastatin] Other (See Comments)    Leg Cramps  . Penicillins Other (See Comments)    Childhood allergy    Current Outpatient Medications  Medication Sig Dispense Refill  . acetaminophen (TYLENOL) 500 MG tablet Take 500 mg by mouth every 6 (six) hours as needed (pain).    Marland Kitchen amLODipine (NORVASC) 2.5 MG tablet TAKE ONE TABLET BY MOUTH EVERY DAY AT 4pm FOR BLOOD PRESSURE 30 tablet 6  . colchicine 0.6 MG tablet Take 0.6 mg by mouth as needed.    Tery Sanfilippo Sodium (COLACE PO) Take 1 capsule by mouth daily.     . furosemide (LASIX) 40 MG tablet Take one tab (40mg ) by mouth alternating with two tabs (80mg ) every other day 180 tablet 1  . isosorbide mononitrate (IMDUR) 30 MG 24 hr tablet Take 1 tablet by mouth daily.    Marland Kitchen levothyroxine (SYNTHROID, LEVOTHROID) 100 MCG tablet Take 100 mcg by mouth daily before breakfast.    . LORazepam (ATIVAN) 1 MG tablet Take 1 mg by mouth 3 (three) times daily.     . metoprolol succinate (TOPROL-XL) 25 MG 24 hr tablet TAKE 1 TABLET BY MOUTH TWICE DAILY 60 tablet 3  .  nitroGLYCERIN (NITROSTAT) 0.4 MG SL tablet Please 1 tablet under the tongue every 5 minutes for up to 3 doses as needed for chest pain. 25 tablet 4  . potassium chloride SA (K-DUR,KLOR-CON) 20 MEQ tablet TAKE 1 TABLET BY MOUTH EVERY DAY 30 tablet 0  . PROAIR HFA 108 (90 Base) MCG/ACT inhaler Inhale 2 puffs into the lungs every 4 (four) hours as needed.    . ranitidine (ZANTAC) 300 MG tablet Take 150 mg by mouth 2 (two) times daily.     . ranolazine (RANEXA) 500 MG 12 hr tablet TAKE 1 TABLET BY MOUTH TWICE DAILY 60 tablet 2   No current facility-administered medications for this visit.     Past Medical History:  Diagnosis Date  . Back pain   . CAD (coronary artery disease)    bypass in 1996 2D Echo preformed EF >55%  . Dyslipidemia   . Dyspnea 10/11/2011   stress test EF 67% normal stress test.  . Edema 06/22/2011   lower arterial for claudication  . Hypertension 06/2011   2D Echo EF>55% mild to moderate tricuspid regurgitation and mild to moderate pulmonary hypertension.  . Hypothyroidism   . Ischemia 10/2011   stress test EF 67% with no ischemia  . SOB (shortness of breath)     Past Surgical History:  Procedure Laterality Date  . ABDOMINAL HYSTERECTOMY    .  CARDIAC CATHETERIZATION  11/16/2006  . CARDIAC CATHETERIZATION  04/04/2001  . CARDIAC CATHETERIZATION N/A 05/12/2014   Procedure: Left Heart Cath and Cors/Grafts Angiography;  Surgeon: Kathleene Hazelhristopher D McAlhany, MD;  [facility];  Service: Cardiovascular;  Laterality: N/A;  . CORONARY ARTERY BYPASS GRAFT  1996   LIMA to the obtuse marginal, saphenous vein graft to the LAD, saphenous vein graft to the obtuse marginal-1, saphenous vien graft to the posterior diagonal artery performed by Dr Alean RinneMichael High    Social History   Socioeconomic History  . Marital status: Widowed    Spouse name: Not on file  . Number of children: Not on file  . Years of education: Not on file  . Highest education level: Not on file    Occupational History  . Not on file  Social Needs  . Financial resource strain: Not on file  . Food insecurity:    Worry: Not on file    Inability: Not on file  . Transportation needs:    Medical: Not on file    Non-medical: Not on file  Tobacco Use  . Smoking status: Never Smoker  . Smokeless tobacco: Never Used  Substance and Sexual Activity  . Alcohol use: No    Alcohol/week: 0.0 standard drinks  . Drug use: No  . Sexual activity: Not on file  Lifestyle  . Physical activity:    Days per week: Not on file    Minutes per session: Not on file  . Stress: Not on file  Relationships  . Social connections:    Talks on phone: Not on file    Gets together: Not on file    Attends religious service: Not on file    Active member of club or organization: Not on file    Attends meetings of clubs or organizations: Not on file    Relationship status: Not on file  . Intimate partner violence:    Fear of current or ex partner: Not on file    Emotionally abused: Not on file    Physically abused: Not on file    Forced sexual activity: Not on file  Other Topics Concern  . Not on file  Social History Narrative  . Not on file     Vitals:   09/18/17 0900  BP: (!) 153/80  Pulse: 70  Weight: 157 lb 9.6 oz (71.5 kg)  Height: 5\' 5"  (1.651 m)    Wt Readings from Last 3 Encounters:  09/18/17 157 lb 9.6 oz (71.5 kg)  11/30/16 177 lb (80.3 kg)  08/30/16 181 lb (82.1 kg)     PHYSICAL EXAM General: NAD HEENT: Normal. Neck: No JVD, no thyromegaly. Lungs: Clear to auscultation bilaterally with normal respiratory effort. CV: Regular rate and irregular rhythm, normal S1/S2, no S3, no murmur.  1+ left leg edema (chronic).     Abdomen: Soft, nontender, no distention.  Neurologic: Alert and oriented.  Psych: Normal affect. Skin: Ecchymoses on bilateral arms and legs. Musculoskeletal: No gross deformities.    ECG: Reviewed above under Subjective   Labs: Lab Results  Component  Value Date/Time   K 4.4 02/16/2015 02:28 PM   BUN 17 02/16/2015 02:28 PM   CREATININE 0.96 (H) 02/16/2015 02:28 PM   ALT 13 09/05/2013 02:41 PM   HGB 10.7 (L) 02/16/2015 02:28 PM     Lipids: Lab Results  Component Value Date/Time   LDLCALC 128 (H) 09/05/2013 02:41 PM   CHOL 211 (H) 09/05/2013 02:41 PM   TRIG  200 (H) 09/05/2013 02:41 PM   HDL 43 09/05/2013 02:41 PM       ASSESSMENT AND PLAN:  1. CAD and CABG: Symptomatically stable. Continue metoprolol succinate, Imdur, and Ranexa. Does not want statin therapy.    No anticoagulation given history of internal bleeds requiring transfusion.  She is not interested in further cardiac invasive procedures.  2. Essential HTN:  Elevated.  This will need further monitoring.  3. Hyperlipidemia: Prefers to avoid statin therapy.  4.  Permanent atrial fibrillation: She had previously been on Eliquis but this was discontinued in April 2019 due to transfusion dependent bleeding/anemia.  Heart rate is controlled on Toprol-XL 25 mg bid.  5. Chronic diastolic heart failure: Symptomatically stable. Continue current diuretic regimen.   Disposition: Follow up 1 year   Prentice Docker, M.D., F.A.C.C.

## 2017-10-05 ENCOUNTER — Other Ambulatory Visit: Payer: Self-pay | Admitting: Cardiovascular Disease

## 2017-11-26 ENCOUNTER — Other Ambulatory Visit: Payer: Self-pay | Admitting: Cardiovascular Disease

## 2017-12-12 ENCOUNTER — Other Ambulatory Visit: Payer: Self-pay | Admitting: Cardiovascular Disease

## 2018-01-05 ENCOUNTER — Other Ambulatory Visit: Payer: Self-pay | Admitting: Cardiovascular Disease

## 2018-02-17 ENCOUNTER — Other Ambulatory Visit: Payer: Self-pay | Admitting: Cardiovascular Disease

## 2018-04-04 DEATH — deceased
